# Patient Record
Sex: Male | Born: 1970 | Race: White | Hispanic: No | Marital: Single | Smoking: Current every day smoker
Health system: Southern US, Community
[De-identification: ages and names within clinical notes are randomized; demographics above are authoritative.]

## PROBLEM LIST (undated history)

## (undated) DIAGNOSIS — J449 Chronic obstructive pulmonary disease, unspecified: Secondary | ICD-10-CM

## (undated) DIAGNOSIS — Z8719 Personal history of other diseases of the digestive system: Secondary | ICD-10-CM

## (undated) DIAGNOSIS — F329 Major depressive disorder, single episode, unspecified: Secondary | ICD-10-CM

## (undated) DIAGNOSIS — R51 Headache: Secondary | ICD-10-CM

## (undated) DIAGNOSIS — K219 Gastro-esophageal reflux disease without esophagitis: Secondary | ICD-10-CM

## (undated) DIAGNOSIS — M1990 Unspecified osteoarthritis, unspecified site: Secondary | ICD-10-CM

## (undated) DIAGNOSIS — F32A Depression, unspecified: Secondary | ICD-10-CM

## (undated) DIAGNOSIS — K469 Unspecified abdominal hernia without obstruction or gangrene: Secondary | ICD-10-CM

## (undated) DIAGNOSIS — F99 Mental disorder, not otherwise specified: Secondary | ICD-10-CM

## (undated) HISTORY — PX: OTHER SURGICAL HISTORY: SHX169

---

## 2013-06-04 ENCOUNTER — Emergency Department (HOSPITAL_COMMUNITY)
Admission: EM | Admit: 2013-06-04 | Discharge: 2013-06-04 | Discharge status: Against Medical Advice | Payer: Self-pay | Attending: Emergency Medicine | Admitting: Emergency Medicine

## 2013-06-04 ENCOUNTER — Encounter (HOSPITAL_COMMUNITY): Payer: Self-pay | Admitting: Emergency Medicine

## 2013-06-04 DIAGNOSIS — J449 Chronic obstructive pulmonary disease, unspecified: Secondary | ICD-10-CM | POA: Insufficient documentation

## 2013-06-04 DIAGNOSIS — J4489 Other specified chronic obstructive pulmonary disease: Secondary | ICD-10-CM | POA: Insufficient documentation

## 2013-06-04 DIAGNOSIS — Z5900 Homelessness unspecified: Secondary | ICD-10-CM | POA: Insufficient documentation

## 2013-06-04 DIAGNOSIS — R1032 Left lower quadrant pain: Secondary | ICD-10-CM | POA: Insufficient documentation

## 2013-06-04 DIAGNOSIS — F17200 Nicotine dependence, unspecified, uncomplicated: Secondary | ICD-10-CM | POA: Insufficient documentation

## 2013-06-04 HISTORY — DX: Chronic obstructive pulmonary disease, unspecified: J44.9

## 2013-06-04 HISTORY — DX: Unspecified abdominal hernia without obstruction or gangrene: K46.9

## 2013-06-04 NOTE — ED Notes (Addendum)
Pt has a hernia that he has had for 4232yrs and he says that the hernia is now "blocking his bowels."  Pt is pointing to the pain in his llq. Pt had a BM several days ago and he says he "may" have a boil on his right buttocks. Pt states "self-medicating" isn't working anymore. Pt smells of alcohol.

## 2013-06-04 NOTE — ED Notes (Signed)
This nurse brought pt back to room 7. Pt advised this nurse that he had drank his whole cup of coffee that this nurse had asked him not too. Pt then asked this nurse for a sandwich. This nurse advised pt that he was here for abdominal pain and that he could not eat or drink anything until seen by a doctor. Pt then said, "well, I'm not in pain. I am homeless and need somewhere to stay tonight." This nurse advised pt that he could not take up a room just because he was homeless. This nurse asked pt if he wanted to be seen and he said, "no!" Pt was taken up to the charge nurses desk where he was discharged due to his refusal of treatment. Pt was taken out to front, he asked where the restrooms were and this nurse advised pt where the restrooms were.

## 2013-06-06 ENCOUNTER — Encounter (HOSPITAL_COMMUNITY): Payer: Self-pay | Admitting: Emergency Medicine

## 2013-06-06 ENCOUNTER — Emergency Department (HOSPITAL_COMMUNITY)
Admission: EM | Admit: 2013-06-06 | Discharge: 2013-06-07 | Disposition: A | Discharge status: Home / Self Care | Payer: Self-pay | Attending: Emergency Medicine | Admitting: Emergency Medicine

## 2013-06-06 DIAGNOSIS — F1010 Alcohol abuse, uncomplicated: Secondary | ICD-10-CM | POA: Insufficient documentation

## 2013-06-06 DIAGNOSIS — R45851 Suicidal ideations: Secondary | ICD-10-CM | POA: Insufficient documentation

## 2013-06-06 DIAGNOSIS — Z8719 Personal history of other diseases of the digestive system: Secondary | ICD-10-CM | POA: Insufficient documentation

## 2013-06-06 DIAGNOSIS — G8929 Other chronic pain: Secondary | ICD-10-CM | POA: Insufficient documentation

## 2013-06-06 DIAGNOSIS — R1110 Vomiting, unspecified: Secondary | ICD-10-CM | POA: Insufficient documentation

## 2013-06-06 DIAGNOSIS — R109 Unspecified abdominal pain: Secondary | ICD-10-CM | POA: Insufficient documentation

## 2013-06-06 DIAGNOSIS — F17200 Nicotine dependence, unspecified, uncomplicated: Secondary | ICD-10-CM | POA: Insufficient documentation

## 2013-06-06 DIAGNOSIS — F10929 Alcohol use, unspecified with intoxication, unspecified: Secondary | ICD-10-CM

## 2013-06-06 DIAGNOSIS — R197 Diarrhea, unspecified: Secondary | ICD-10-CM | POA: Insufficient documentation

## 2013-06-06 DIAGNOSIS — J449 Chronic obstructive pulmonary disease, unspecified: Secondary | ICD-10-CM | POA: Insufficient documentation

## 2013-06-06 DIAGNOSIS — J4489 Other specified chronic obstructive pulmonary disease: Secondary | ICD-10-CM | POA: Insufficient documentation

## 2013-06-06 HISTORY — DX: Mental disorder, not otherwise specified: F99

## 2013-06-06 HISTORY — DX: Major depressive disorder, single episode, unspecified: F32.9

## 2013-06-06 HISTORY — DX: Depression, unspecified: F32.A

## 2013-06-06 LAB — CBC WITH DIFFERENTIAL/PLATELET
BASOS PCT: 0 % (ref 0–1)
Basophils Absolute: 0 10*3/uL (ref 0.0–0.1)
Eosinophils Absolute: 0.3 10*3/uL (ref 0.0–0.7)
Eosinophils Relative: 4 % (ref 0–5)
HCT: 43 % (ref 39.0–52.0)
HEMOGLOBIN: 14.8 g/dL (ref 13.0–17.0)
Lymphocytes Relative: 41 % (ref 12–46)
Lymphs Abs: 2.9 10*3/uL (ref 0.7–4.0)
MCH: 31.6 pg (ref 26.0–34.0)
MCHC: 34.4 g/dL (ref 30.0–36.0)
MCV: 91.9 fL (ref 78.0–100.0)
MONO ABS: 0.5 10*3/uL (ref 0.1–1.0)
Monocytes Relative: 7 % (ref 3–12)
NEUTROS ABS: 3.4 10*3/uL (ref 1.7–7.7)
Neutrophils Relative %: 48 % (ref 43–77)
Platelets: 229 10*3/uL (ref 150–400)
RBC: 4.68 MIL/uL (ref 4.22–5.81)
RDW: 14.7 % (ref 11.5–15.5)
WBC: 7.1 10*3/uL (ref 4.0–10.5)

## 2013-06-06 LAB — COMPREHENSIVE METABOLIC PANEL
ALBUMIN: 3.8 g/dL (ref 3.5–5.2)
ALT: 34 U/L (ref 0–53)
AST: 41 U/L — AB (ref 0–37)
Alkaline Phosphatase: 85 U/L (ref 39–117)
BUN: 7 mg/dL (ref 6–23)
CALCIUM: 9 mg/dL (ref 8.4–10.5)
CO2: 25 mEq/L (ref 19–32)
Chloride: 105 mEq/L (ref 96–112)
Creatinine, Ser: 0.73 mg/dL (ref 0.50–1.35)
GFR calc non Af Amer: >90 mL/min (ref >90)
GLUCOSE: 82 mg/dL (ref 70–99)
Potassium: 3.5 mEq/L — ABNORMAL LOW (ref 3.7–5.3)
SODIUM: 144 meq/L (ref 137–147)
TOTAL PROTEIN: 7 g/dL (ref 6.0–8.3)
Total Bilirubin: <0.2 mg/dL — ABNORMAL LOW (ref 0.3–1.2)

## 2013-06-06 LAB — LIPASE, BLOOD: Lipase: 36 U/L (ref 11–59)

## 2013-06-06 LAB — ETHANOL: Alcohol, Ethyl (B): 304 mg/dL — ABNORMAL HIGH (ref 0–11)

## 2013-06-06 MED ORDER — NICOTINE 14 MG/24HR TD PT24
14.0000 mg | MEDICATED_PATCH | Freq: Every day | TRANSDERMAL | Status: DC
  Administered 2013-06-07: 14 mg via TRANSDERMAL
  Filled 2013-06-06: qty 1

## 2013-06-06 MED ORDER — ONDANSETRON HCL 4 MG PO TABS: 4.0000 mg | ORAL_TABLET | Freq: Three times a day (TID) | ORAL | Status: DC | PRN

## 2013-06-06 MED ORDER — LORAZEPAM 1 MG PO TABS
1.0000 mg | ORAL_TABLET | ORAL | Status: DC | PRN
Start: 2013-06-06 — End: 2013-06-07

## 2013-06-06 NOTE — ED Provider Notes (Signed)
CSN: [id number]     Arrival date & time 06/06/13  2019 History   First MD Initiated Contact with Patient 06/06/13 2046     Chief Complaint  Patient presents with  . V70.1   Patient is a 43 y.o. male presenting with intoxication. The history is provided by the patient.  Alcohol Intoxication This is a new problem. The current episode started 6 to 12 hours ago. The problem occurs constantly. The problem has been gradually worsening. Associated symptoms include abdominal pain. Nothing aggravates the symptoms. Nothing relieves the symptoms.  pt presents for multiple complaints He reports he has been drinking ETOH all day and he is now suicidal - when asked his plan he reports "hang myself, or cut myself" He reports drinking only ETOH, denies drug use or other co-ingestants He also reports chronic abd pain and vomiting/diarrhea. His history is limited due to intoxication.    Past Medical History  Diagnosis Date  . COPD (chronic obstructive pulmonary disease)   . Hernia of abdominal cavity    Past Surgical History  Procedure Laterality Date  . 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     History reviewed. No pertinent family history. History  Substance Use Topics  . Smoking status: Current Every Day Smoker  . Smokeless tobacco: Not on file  . Alcohol Use: Yes    Review of Systems  Unable to perform ROS: Other  Gastrointestinal: Positive for abdominal pain.  pt is intoxicated    Allergies  Review of patient's allergies indicates no known allergies.  Home Medications  No current outpatient prescriptions on file. BP 107/80  Pulse 96  Temp(Src) 97.4 F (36.3 C) (Oral)  Resp 20  Ht 5\' 9"  (1.753 m)  Wt 130 lb (58.968 kg)  BMI 19.19 kg/m2  SpO2 96% Physical Exam CONSTITUTIONAL: thin, disheveled HEAD: Normocephalic/atraumatic EYES: EOMI ENMT: Mucous membranes dry NECK: supple no meningeal signs, well healed scars to neck, no erythema  or bruising noted CV: S1/S2 noted, no murmurs/rubs/gallops noted LUNGS: Lungs are clear to auscultation bilaterally, no apparent distress ABDOMEN: soft, nontender, no rebound or guarding.  Well healed scars noted GU:no cva tenderness NEURO: Pt is awake/alert, moves all extremitiesx4 EXTREMITIES: pulses normal, full ROM SKIN: warm, color normal PSYCH: anxious  ED Course  Procedures (including critical care time) 9:04 PM Pt is currently intoxicated reporting SI Labs pending at this time 10:28 PM ETOH intoxication noted Pt stable, awaiting telepsych, he is medically stable BP 107/80  Pulse 96  Temp(Src) 97.4 F (36.3 C) (Oral)  Resp 20  Ht 5\' 9"  (1.753 m)  Wt 130 lb (58.968 kg)  BMI 19.19 kg/m2  SpO2 96%   Labs Review Labs Reviewed  COMPREHENSIVE METABOLIC PANEL  CBC WITH DIFFERENTIAL  ETHANOL  URINE RAPID DRUG SCREEN (HOSP PERFORMED)  LIPASE, BLOOD   Imaging Review No results found.  EKG Interpretation   None       MDM   Final diagnoses:  None    Nursing notes including past medical history and social history reviewed and considered in documentation Labs/vital reviewed and considered     Joya Gaskinsonald W Tenleigh Byer, MD 06/06/13 2228

## 2013-06-06 NOTE — ED Notes (Signed)
Encouraged patient to urinate. Patient reports he will try.

## 2013-06-06 NOTE — ED Notes (Addendum)
abd pain, Has had "several beer today".  Pt says vomiting and diarrhea.  Then says he is suicidal  Wanded at triage

## 2013-06-07 ENCOUNTER — Inpatient Hospital Stay (HOSPITAL_COMMUNITY)
Admission: EM | Admit: 2013-06-07 | Discharge: 2013-06-14 | DRG: 897 | Disposition: A | Discharge status: Other Acute Inpatient Hospital | Payer: 59 | Source: Intra-hospital | Attending: General Surgery | Admitting: General Surgery

## 2013-06-07 ENCOUNTER — Encounter (HOSPITAL_COMMUNITY): Payer: Self-pay | Admitting: *Deleted

## 2013-06-07 DIAGNOSIS — F4310 Post-traumatic stress disorder, unspecified: Secondary | ICD-10-CM | POA: Diagnosis present

## 2013-06-07 DIAGNOSIS — M129 Arthropathy, unspecified: Secondary | ICD-10-CM | POA: Diagnosis present

## 2013-06-07 DIAGNOSIS — F322 Major depressive disorder, single episode, severe without psychotic features: Secondary | ICD-10-CM | POA: Diagnosis present

## 2013-06-07 DIAGNOSIS — J4489 Other specified chronic obstructive pulmonary disease: Secondary | ICD-10-CM | POA: Diagnosis present

## 2013-06-07 DIAGNOSIS — K4030 Unilateral inguinal hernia, with obstruction, without gangrene, not specified as recurrent: Secondary | ICD-10-CM | POA: Diagnosis present

## 2013-06-07 DIAGNOSIS — K219 Gastro-esophageal reflux disease without esophagitis: Secondary | ICD-10-CM | POA: Diagnosis present

## 2013-06-07 DIAGNOSIS — G4700 Insomnia, unspecified: Secondary | ICD-10-CM | POA: Diagnosis present

## 2013-06-07 DIAGNOSIS — F410 Panic disorder [episodic paroxysmal anxiety] without agoraphobia: Secondary | ICD-10-CM | POA: Diagnosis present

## 2013-06-07 DIAGNOSIS — Z5900 Homelessness unspecified: Secondary | ICD-10-CM

## 2013-06-07 DIAGNOSIS — F411 Generalized anxiety disorder: Secondary | ICD-10-CM | POA: Diagnosis present

## 2013-06-07 DIAGNOSIS — F329 Major depressive disorder, single episode, unspecified: Secondary | ICD-10-CM

## 2013-06-07 DIAGNOSIS — J449 Chronic obstructive pulmonary disease, unspecified: Secondary | ICD-10-CM | POA: Diagnosis present

## 2013-06-07 DIAGNOSIS — F17200 Nicotine dependence, unspecified, uncomplicated: Secondary | ICD-10-CM | POA: Diagnosis present

## 2013-06-07 DIAGNOSIS — R45851 Suicidal ideations: Secondary | ICD-10-CM

## 2013-06-07 DIAGNOSIS — F1994 Other psychoactive substance use, unspecified with psychoactive substance-induced mood disorder: Secondary | ICD-10-CM | POA: Diagnosis present

## 2013-06-07 DIAGNOSIS — F1020 Alcohol dependence, uncomplicated: Principal | ICD-10-CM | POA: Diagnosis present

## 2013-06-07 HISTORY — DX: Headache: R51

## 2013-06-07 HISTORY — DX: Unspecified osteoarthritis, unspecified site: M19.90

## 2013-06-07 HISTORY — DX: Gastro-esophageal reflux disease without esophagitis: K21.9

## 2013-06-07 HISTORY — DX: Personal history of other diseases of the digestive system: Z87.19

## 2013-06-07 LAB — RAPID URINE DRUG SCREEN, HOSP PERFORMED
Amphetamines: NOT DETECTED
BARBITURATES: NOT DETECTED
BENZODIAZEPINES: NOT DETECTED
COCAINE: NOT DETECTED
Opiates: NOT DETECTED
TETRAHYDROCANNABINOL: NOT DETECTED

## 2013-06-07 LAB — ETHANOL: Alcohol, Ethyl (B): 112 mg/dL — ABNORMAL HIGH (ref 0–11)

## 2013-06-07 MED ORDER — CHLORDIAZEPOXIDE HCL 25 MG PO CAPS
25.0000 mg | ORAL_CAPSULE | Freq: Four times a day (QID) | ORAL | Status: AC
  Administered 2013-06-07 (×3): 25 mg via ORAL
  Filled 2013-06-07 (×4): qty 1

## 2013-06-07 MED ORDER — GI COCKTAIL ~~LOC~~
30.0000 mL | Freq: Three times a day (TID) | ORAL | Status: DC | PRN
  Administered 2013-06-07 – 2013-06-14 (×16): 30 mL via ORAL
  Filled 2013-06-07 (×6): qty 30

## 2013-06-07 MED ORDER — CHLORDIAZEPOXIDE HCL 25 MG PO CAPS
25.0000 mg | ORAL_CAPSULE | Freq: Three times a day (TID) | ORAL | Status: AC
  Administered 2013-06-08 (×3): 25 mg via ORAL
  Filled 2013-06-07 (×2): qty 1

## 2013-06-07 MED ORDER — GI COCKTAIL ~~LOC~~
30.0000 mL | Freq: Once | ORAL | Status: AC
  Administered 2013-06-07: 30 mL via ORAL
  Filled 2013-06-07: qty 30

## 2013-06-07 MED ORDER — CHLORDIAZEPOXIDE HCL 25 MG PO CAPS
25.0000 mg | ORAL_CAPSULE | ORAL | Status: AC
  Administered 2013-06-09: 25 mg via ORAL
  Filled 2013-06-07 (×4): qty 1

## 2013-06-07 MED ORDER — VITAMIN B-1 100 MG PO TABS
100.0000 mg | ORAL_TABLET | Freq: Every day | ORAL | Status: DC
  Administered 2013-06-08 – 2013-06-14 (×6): 100 mg via ORAL
  Filled 2013-06-07 (×10): qty 1

## 2013-06-07 MED ORDER — CHLORDIAZEPOXIDE HCL 25 MG PO CAPS
25.0000 mg | ORAL_CAPSULE | Freq: Every day | ORAL | Status: AC
  Administered 2013-06-10: 25 mg via ORAL

## 2013-06-07 MED ORDER — LOPERAMIDE HCL 2 MG PO CAPS: 2.0000 mg | ORAL_CAPSULE | ORAL | Status: AC | PRN

## 2013-06-07 MED ORDER — ADULT MULTIVITAMIN W/MINERALS CH
1.0000 | ORAL_TABLET | Freq: Every day | ORAL | Status: DC
  Administered 2013-06-07 – 2013-06-14 (×7): 1 via ORAL
  Filled 2013-06-07 (×10): qty 1

## 2013-06-07 MED ORDER — ACETAMINOPHEN 325 MG PO TABS
650.0000 mg | ORAL_TABLET | Freq: Four times a day (QID) | ORAL | Status: DC | PRN
Start: 2013-06-07 — End: 2013-06-14
  Administered 2013-06-07 – 2013-06-13 (×6): 650 mg via ORAL
  Filled 2013-06-07 (×7): qty 2

## 2013-06-07 MED ORDER — HYDROXYZINE HCL 25 MG PO TABS
25.0000 mg | ORAL_TABLET | Freq: Four times a day (QID) | ORAL | Status: AC | PRN
  Administered 2013-06-09: 25 mg via ORAL
  Filled 2013-06-07: qty 1

## 2013-06-07 MED ORDER — TRAZODONE HCL 50 MG PO TABS
50.0000 mg | ORAL_TABLET | Freq: Every evening | ORAL | Status: DC | PRN
  Filled 2013-06-07 (×7): qty 1

## 2013-06-07 MED ORDER — MAGNESIUM HYDROXIDE 400 MG/5ML PO SUSP
30.0000 mL | Freq: Every day | ORAL | Status: DC | PRN
Start: 2013-06-07 — End: 2013-06-14

## 2013-06-07 MED ORDER — CHLORDIAZEPOXIDE HCL 25 MG PO CAPS
25.0000 mg | ORAL_CAPSULE | Freq: Four times a day (QID) | ORAL | Status: AC | PRN
  Administered 2013-06-09: 25 mg via ORAL
  Filled 2013-06-07: qty 1

## 2013-06-07 MED ORDER — ONDANSETRON 4 MG PO TBDP: 4.0000 mg | ORAL_TABLET | Freq: Four times a day (QID) | ORAL | Status: AC | PRN

## 2013-06-07 MED ORDER — PANTOPRAZOLE SODIUM 40 MG PO TBEC
40.0000 mg | DELAYED_RELEASE_TABLET | Freq: Two times a day (BID) | ORAL | Status: DC
  Administered 2013-06-07 – 2013-06-14 (×15): 40 mg via ORAL
  Filled 2013-06-07 (×2): qty 1
  Filled 2013-06-07: qty 28
  Filled 2013-06-07 (×12): qty 1
  Filled 2013-06-07: qty 28
  Filled 2013-06-07 (×6): qty 1

## 2013-06-07 MED ORDER — THIAMINE HCL 100 MG/ML IJ SOLN
100.0000 mg | Freq: Once | INTRAMUSCULAR | Status: DC
  Filled 2013-06-07: qty 2

## 2013-06-07 MED ORDER — ALUM & MAG HYDROXIDE-SIMETH 200-200-20 MG/5ML PO SUSP: 30.0000 mL | ORAL | Status: DC | PRN

## 2013-06-07 MED ORDER — ENSURE COMPLETE PO LIQD
237.0000 mL | Freq: Two times a day (BID) | ORAL | Status: DC
  Administered 2013-06-07 – 2013-06-14 (×14): 237 mL via ORAL
  Filled 2013-06-07: qty 237

## 2013-06-07 NOTE — ED Notes (Signed)
Patient requesting nicotine patch

## 2013-06-07 NOTE — [facility] Suicide Risk Assessment (Signed)
[facility] INPATIENT:  Family/Significant Other Suicide Prevention Education  Suicide Prevention Education:  Patient Refusal for Family/Significant Other Suicide Prevention Education: The patient Bonnee QuinDonald Sudberry has refused to provide written consent for family/significant other to be provided Family/Significant Other Suicide Prevention Education during admission and/or prior to discharge.  Physician notified.   Pt reported that he has no family supports or supports within the community. SPE completed with pt. SPI pamphlet provided to pt and he was encouraged to share information with support network, ask questions, and talk about any concerns relating to SPE. Pt reports passive SI currently but is able to contract for safety on the unit.   Smart, Vayla Wilhelmi LCSWA  06/07/2013, 10:37 AM

## 2013-06-07 NOTE — ED Notes (Signed)
Gave patient sprite, crackers and peanut butter as requested.

## 2013-06-07 NOTE — Progress Notes (Signed)
Patient ID: Bryan Myers, male   DOB: [birth year], 43 y.o.   MRN: [id number] He was up and went down for noon meal and for his medication. Has been in bed this afternoon. No c/o  Withdrawals.
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
## 2013-06-07 NOTE — BH Assessment (Signed)
Assessment Note  Bryan Myers is a 43 y.o. male who presented to [facility], c/o SI w/plan to hang himself.  Pt says--"give me a rope and I'll do the rest".  Pt reports SI thoughts x2 yrs and has had "several attempts" by hanging, electrocution and cutting throat.  Pt says he was in the hospital for the last attempt(cutting throat) approx 2-3 yrs ago in a state hospital in [place].  Pt denies HI/AVH.  Pt reports drinking 2-12, 12oz cans of beer, daily, last drink was 06/06/13, pt drank 12 beers.  Pt is homeless.  Pt denies current w/d sxs.  Pt accepted by Donell SievertSpencer Simon, [place]; [phone].  Axis I: Disruptive mood dysregulation disorder; Alcohol use disorder, Severe Axis II: Deferred Axis III:  Past Medical History  Diagnosis Date  . COPD (chronic obstructive pulmonary disease)   . Hernia of abdominal cavity   . Mental disorder   . Depression    Axis IV: economic problems, housing problems, occupational problems, other psychosocial or environmental problems, problems related to social environment and problems with primary support group Axis V: 31-40 impairment in reality testing  Past Medical History:  Past Medical History  Diagnosis Date  . COPD (chronic obstructive pulmonary disease)   . Hernia of abdominal cavity   . Mental disorder   . Depression     Past Surgical History  Procedure Laterality Date  . 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      Family History: History reviewed. No pertinent family history.  Social History:  reports that he has been smoking.  He does not have any smokeless tobacco history on file. He reports that he drinks alcohol. He reports that he does not use illicit drugs.  Additional Social History:  Alcohol / Drug Use Pain Medications: None  Prescriptions: None  Over the Counter: None  History of alcohol / drug use?: Yes Longest period of sobriety (when/how long): None  Negative Consequences of Use: Work / School;Personal  relationships;Financial Withdrawal Symptoms: Other (Comment) (No current w/d sxs ) Substance #1 Name of Substance 1: Alcohol  1 - Age of First Use: Teens  1 - Amount (size/oz): 2-12 Beers  1 - Frequency: Daily  1 - Duration: On-going  1 - Last Use / Amount: 06/06/13  CIWA: CIWA-Ar BP: 119/77 mmHg Pulse Rate: 94 Nausea and Vomiting: no nausea and no vomiting Tactile Disturbances: none Tremor: no tremor Auditory Disturbances: not present Paroxysmal Sweats: no sweat visible Visual Disturbances: not present Anxiety: no anxiety, at ease Headache, Fullness in Head: none present Agitation: normal activity Orientation and Clouding of Sensorium: oriented and can do serial additions CIWA-Ar Total: 0 COWS:    Allergies: No Known Allergies  Home Medications:  (Not in a hospital admission)  OB/GYN Status:  No LMP for male patient.  General Assessment Data Location of Assessment: [facility] Is this a Tele or Face-to-Face Assessment?: Tele Assessment Is this an Initial Assessment or a Re-assessment for this encounter?: Initial Assessment Living Arrangements: Other (Comment) (Homeless ) Can pt return to current living arrangement?: Yes Admission Status: Voluntary Is patient capable of signing voluntary admission?: Yes Transfer from: Acute Hospital Referral Source: MD  Medical Screening Exam [facility] Walk-in ONLY) Medical Exam completed: No Reason for MSE not completed: Other: (None )  [facility] Living Arrangements: Other (Comment) (Homeless ) Name of Psychiatrist: None  Name of Therapist: None   Education Status Is patient currently in school?: No Current Grade: None  Highest grade of school patient has completed: None  Name of school: None  Contact person: None   Risk to self Suicidal Ideation: Yes-Currently Present Suicidal Intent: Yes-Currently Present Is patient at risk for suicide?: Yes Suicidal Plan?: Yes-Currently Present Specify Current Suicidal Plan: Hang  Self  Access to Means: Yes Specify Access to Suicidal Means: Ropes  What has been your use of drugs/alcohol within the last 12 months?: Abusing: alcohol  Previous Attempts/Gestures: Yes How many times?:  ("Several" ) Other Self Harm Risks: None  Triggers for Past Attempts: Unpredictable Intentional Self Injurious Behavior: None Family Suicide History: No Recent stressful life event(s): Other (Comment);Financial Problems (Homeless ) Persecutory voices/beliefs?: No Depression: Yes Depression Symptoms: Loss of interest in usual pleasures;Feeling angry/irritable;Feeling worthless/self pity Substance abuse history and/or treatment for substance abuse?: Yes Suicide prevention information given to non-admitted patients: Not applicable  Risk to Others Homicidal Ideation: No Thoughts of Harm to Others: No Current Homicidal Intent: No Current Homicidal Plan: No Access to Homicidal Means: No Identified Victim: None  History of harm to others?: No Assessment of Violence: None Noted Violent Behavior Description: None  Does patient have access to weapons?: No Criminal Charges Pending?: No Does patient have a court date: No  Psychosis Hallucinations: None noted Delusions: None noted  Mental Status Report Appear/Hygiene: Disheveled;Poor hygiene Eye Contact: Fair Motor Activity: Unremarkable Speech: Logical/coherent;Soft Level of Consciousness: Alert Mood: Irritable Affect: Irritable Anxiety Level: None Thought Processes: Coherent;Relevant Judgement: Impaired Orientation: Person;Place;Time;Situation Obsessive Compulsive Thoughts/Behaviors: None  Cognitive Functioning Concentration: Normal Memory: Recent Intact;Remote Intact IQ: Average Insight: Poor Impulse Control: Poor Appetite: Fair Weight Loss: 0 Weight Gain: 0 Sleep: Decreased Total Hours of Sleep: 5 Vegetative Symptoms: None  ADLScreening [facility] Assessment Services) Patient's cognitive ability adequate to safely  complete daily activities?: Yes Patient able to express need for assistance with ADLs?: Yes Independently performs ADLs?: Yes (appropriate for developmental age)  Prior Inpatient Therapy Prior Inpatient Therapy: Yes Prior Therapy Dates: 2-3 yrs ago  Prior Therapy Facilty/Provider(s): [place]  Reason for Treatment: Detox   Prior Outpatient Therapy Prior Outpatient Therapy: No Prior Therapy Dates: None  Prior Therapy Facilty/Provider(s): None  Reason for Treatment: None   ADL Screening (condition at time of admission) Patient's cognitive ability adequate to safely complete daily activities?: Yes Is the patient deaf or have difficulty hearing?: No Does the patient have difficulty seeing, even when wearing glasses/contacts?: No Does the patient have difficulty concentrating, remembering, or making decisions?: No Patient able to express need for assistance with ADLs?: Yes Does the patient have difficulty dressing or bathing?: No Independently performs ADLs?: Yes (appropriate for developmental age) Does the patient have difficulty walking or climbing stairs?: No Weakness of Legs: None Weakness of Arms/Hands: None  Home Assistive Devices/Equipment Home Assistive Devices/Equipment: None  Therapy Consults (therapy consults require a physician order) PT Evaluation Needed: No OT Evalulation Needed: No SLP Evaluation Needed: No Abuse/Neglect Assessment (Assessment to be complete while patient is alone) Physical Abuse: Denies Verbal Abuse: Denies Sexual Abuse: Denies Exploitation of patient/patient's resources: Denies Self-Neglect: Denies Values / Beliefs Cultural Requests During Hospitalization: None Spiritual Requests During Hospitalization: None Consults Spiritual Care Consult Needed: No Social Work Consult Needed: No Merchant navy officer (For Healthcare) Advance Directive: Patient does not have advance directive;Patient would not like information Pre-existing out of facility DNR  order (yellow form or pink MOST form): No Nutrition Screen- [facility] Adult/[facility] Patient's home diet: Regular  Additional Information 1:1 In Past 12 Months?: No CIRT Risk: No Elopement Risk: No Does patient  have medical clearance?: Yes     Disposition:  Disposition Initial Assessment Completed for this Encounter: Yes Disposition of Patient: Inpatient treatment program;Referred to (Accepted by Donell Sievert, PA; [phone]) Type of inpatient treatment program: Adult Patient referred to: Other (Comment) (Accepted by Donell Sievert, PA; [phone])  On Site Evaluation by:   Reviewed with Physician:    Murrell Redden 06/07/2013 4:58 AM

## 2013-06-07 NOTE — Progress Notes (Signed)
Pt's BP was 88/54.  Pt asymptomatic.  Gatorade given.  Will recheck.

## 2013-06-07 NOTE — [facility] Group Notes (Signed)
[facility] LCSW Group Therapy  06/07/2013 3:20 PM  Type of Therapy:  Group Therapy  Participation Level:  Did Not Attend-pt sleeping   Smart, Ila Landowski LCSWA  06/07/2013, 3:20 PM
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
## 2013-06-07 NOTE — Progress Notes (Signed)
Recreation Therapy Notes  Animal-Assisted Activity/Therapy (AAA/T) Program Checklist/Progress Notes Patient Eligibility Criteria Checklist & Daily Group note for Rec Tx Intervention  Date: 02.10.2015 Time: 2:45pm [address]    AAA/T Program Assumption of Risk Form signed by Patient/ or Parent Legal Guardian yes  Patient is free of allergies or sever asthma yes  Patient reports no fear of animals yes  Patient reports no history of cruelty to animals yes   Patient understands his/her participation is voluntary yes  Behavioral Response: Did not attend  [employer] Specialty ChemicalsDenise L Rayson Rando, LRT/CTRS  Kaijah Abts L 06/07/2013 4:21 PM
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
## 2013-06-08 MED ORDER — NICOTINE 21 MG/24HR TD PT24
21.0000 mg | MEDICATED_PATCH | Freq: Every day | TRANSDERMAL | Status: DC
  Administered 2013-06-08 – 2013-06-14 (×7): 21 mg via TRANSDERMAL
  Filled 2013-06-08 (×10): qty 1

## 2013-06-08 MED ORDER — TRAZODONE HCL 50 MG PO TABS
75.0000 mg | ORAL_TABLET | Freq: Every day | ORAL | Status: DC
  Administered 2013-06-08 – 2013-06-13 (×5): 75 mg via ORAL
  Filled 2013-06-08 (×6): qty 1.5
  Filled 2013-06-08: qty 21
  Filled 2013-06-08: qty 1.5

## 2013-06-08 MED ORDER — MIRTAZAPINE 15 MG PO TABS
15.0000 mg | ORAL_TABLET | Freq: Every day | ORAL | Status: DC
  Administered 2013-06-08: 15 mg via ORAL
  Filled 2013-06-08 (×3): qty 1
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
## 2013-06-08 NOTE — Progress Notes (Signed)
Patient ID: Bryan Myers, male   DOB: 03/04/1971, 43 y.o.   MRN: [id number] D: Pt. Visible on dayroom watching TV this evening. Pt. Reports "getting better" reports depression at "10" of 10. A: Writer introduced self to client and reviewed medications. Staff will monitor q4215min for safety. R: Pt. Is safe on the unit and attended group.

## 2013-06-08 NOTE — [facility] Group Notes (Signed)
[facility]   06/08/2013 10:49 AM  Participation Quality:  DID NOT ATTEND-pt refused to attend d/c planning and returned to room to rest.   Smart, Lebron QuamHeather LCSWA

## 2013-06-08 NOTE — Progress Notes (Signed)
[facility] MD Progress Note  06/08/2013 2:36 PM Forney Kleinpeter  MRN:  [id number]  Subjective:  Sriman reports, "I guess I'm getting there. I am shaking and hurting all over, aches and pain all over my body. I'm sleeping for may be 4-5 hours every night, my appetite is very poor. My depression rate is at #10, anxiety #8. I feel suicidal all the time. I'm just not doing my best at this time".  Diagnosis:   DSM5: Schizophrenia Disorders:  NA Obsessive-Compulsive Disorders:  NA Trauma-Stressor Disorders:  Posttraumatic Stress Disorder (309.81) Substance/Addictive Disorders:  Alcohol Related Disorder - Severe (303.90) Depressive Disorders:  Substance induced mood disorder  Total Time spent with patient: 30 minutes  Axis I: Alcohol Related Disorder - Severe (303.90), PTSD, Substance induced mood disorder Axis II: Deferred Axis III:  Past Medical History  Diagnosis Date  . COPD (chronic obstructive pulmonary disease)   . Hernia of abdominal cavity   . Mental disorder   . Depression   . GERD (gastroesophageal reflux disease)   . H/O hiatal hernia   . Headache(784.0)   . Arthritis    Axis IV: other psychosocial or environmental problems and Alcoholism, chronic Axis V: 41-50 serious symptoms  ADL's:  Fairly intact  Sleep: "Poor"  Appetite:  "Poor"  Suicidal Ideation: "Yes, all the time" Plan:  Denies Intent:  Denies Means:  Denies  Homicidal Ideation:  Plan:  Denies Intent:  Denies Means:  Denies  AEB (as evidenced by):  Psychiatric Specialty Exam: Physical Exam  Review of Systems  Constitutional: Positive for malaise/fatigue.  HENT: Negative.   Eyes: Negative.   Respiratory: Negative.   Cardiovascular: Negative.   Gastrointestinal: Negative.   Genitourinary: Negative.   Musculoskeletal: Positive for back pain, joint pain and myalgias.  Skin: Negative.   Neurological: Positive for tremors and weakness.  Endo/Heme/Allergies: Negative.   Psychiatric/Behavioral: Positive for  depression (Rated #10), suicidal ideas (Constant) and substance abuse (Alcoholism). Negative for hallucinations and memory loss. The patient is nervous/anxious and has insomnia.     Blood pressure 92/60, pulse 77, temperature 98.4 F (36.9 C), temperature source Oral, resp. rate 18, height $RemoveBe'5\' 10"'unBYvLawb$  (1.778 m), weight 58.968 kg (130 lb), SpO2 96.00%.Body mass index is 18.65 kg/(m^2).  General Appearance: Casual, Guarded and sad  Eye Contact::  Fair  Speech:  Clear and Coherent  Volume:  Normal  Mood:  Depressed  Affect:  Congruent and Flat  Thought Process:  Coherent and Intact  Orientation:  Full (Time, Place, and Person)  Thought Content:  Rumination  Suicidal Thoughts:  Yes.  without intent/plan  Homicidal Thoughts:  No  Memory:  Immediate;   Good Recent;   Good Remote;   Good  Judgement:  Fair  Insight:  Fair  Psychomotor Activity:  Restlessness and Tremor  Concentration:  Fair  Recall:  Good  Fund of Knowledge:Good  Language: Good  Akathisia:  No  Handed:  Right  AIMS (if indicated):     Assets:  Desire for Improvement  Sleep:  Number of Hours: 6.75   Musculoskeletal: Strength & Muscle Tone: within normal limits Gait & Station: normal Patient leans: N/A  Current Medications: Current Facility-Administered Medications  Medication Dose Route Frequency Provider Last Rate Last Dose  . acetaminophen (TYLENOL) tablet 650 mg  650 mg Oral Q6H PRN Laverle Hobby, PA-C   650 mg at 06/08/13 0854  . alum & mag hydroxide-simeth (MAALOX/MYLANTA) 200-200-20 MG/5ML suspension 30 mL  30 mL Oral Q4H PRN Laverle Hobby, PA-C      .  chlordiazePOXIDE (LIBRIUM) capsule 25 mg  25 mg Oral Q6H PRN Laverle Hobby, PA-C      . chlordiazePOXIDE (LIBRIUM) capsule 25 mg  25 mg Oral TID Laverle Hobby, PA-C   25 mg at 06/08/13 1207   Followed by  . [START ON 06/09/2013] chlordiazePOXIDE (LIBRIUM) capsule 25 mg  25 mg Oral BH-qamhs Spencer E Simon, PA-C       Followed by  . [START ON 06/10/2013]  chlordiazePOXIDE (LIBRIUM) capsule 25 mg  25 mg Oral Daily Spencer E Simon, PA-C      . feeding supplement (ENSURE COMPLETE) (ENSURE COMPLETE) liquid 237 mL  237 mL Oral BID BM Christie Beckers, RD   237 mL at 06/08/13 1423  . gi cocktail (Maalox,Lidocaine,Donnatal)  30 mL Oral TID PRN Nicholaus Bloom, MD   30 mL at 06/08/13 1422  . hydrOXYzine (ATARAX/VISTARIL) tablet 25 mg  25 mg Oral Q6H PRN Laverle Hobby, PA-C      . loperamide (IMODIUM) capsule 2-4 mg  2-4 mg Oral PRN Laverle Hobby, PA-C      . magnesium hydroxide (MILK OF MAGNESIA) suspension 30 mL  30 mL Oral Daily PRN Laverle Hobby, PA-C      . multivitamin with minerals tablet 1 tablet  1 tablet Oral Daily Laverle Hobby, PA-C   1 tablet at 06/08/13 0851  . nicotine (NICODERM CQ - dosed in mg/24 hours) patch 21 mg  21 mg Transdermal Q0600 Nicholaus Bloom, MD   21 mg at 06/08/13 1422  . ondansetron (ZOFRAN-ODT) disintegrating tablet 4 mg  4 mg Oral Q6H PRN Laverle Hobby, PA-C      . pantoprazole (PROTONIX) EC tablet 40 mg  40 mg Oral BID Nicholaus Bloom, MD   40 mg at 06/08/13 0851  . thiamine (B-1) injection 100 mg  100 mg Intramuscular Once [employer], PA-C      . thiamine (VITAMIN B-1) tablet 100 mg  100 mg Oral Daily Laverle Hobby, PA-C   100 mg at 06/08/13 0851  . traZODone (DESYREL) tablet 50 mg  50 mg Oral QHS,MR X 1 Laverle Hobby, PA-C        Lab Results:  Results for orders placed during the hospital encounter of 06/06/13 (from the past 48 hour(s))  COMPREHENSIVE METABOLIC PANEL     Status: Abnormal   Collection Time    06/06/13  9:18 PM      Result Value Ref Range   Sodium 144  137 - 147 mEq/L   Potassium 3.5 (*) 3.7 - 5.3 mEq/L   Chloride 105  96 - 112 mEq/L   CO2 25  19 - 32 mEq/L   Glucose, Bld 82  70 - 99 mg/dL   BUN 7  6 - 23 mg/dL   Creatinine, Ser 0.73  0.50 - 1.35 mg/dL   Calcium 9.0  8.4 - 10.5 mg/dL   Total Protein 7.0  6.0 - 8.3 g/dL   Albumin 3.8  3.5 - 5.2 g/dL   AST 41 (*) 0 - 37 U/L   ALT 34   0 - 53 U/L   Alkaline Phosphatase 85  39 - 117 U/L   Total Bilirubin <0.2 (*) 0.3 - 1.2 mg/dL   GFR calc non Af Amer >90  >90 mL/min   GFR calc Af Amer >90  >90 mL/min   Comment: (NOTE)     The eGFR has been calculated using the CKD EPI equation.  This calculation has not been validated in all clinical situations.     eGFR's persistently <90 mL/min signify possible Chronic Kidney     Disease.  CBC WITH DIFFERENTIAL     Status: None   Collection Time    06/06/13  9:18 PM      Result Value Ref Range   WBC 7.1  4.0 - 10.5 K/uL   RBC 4.68  4.22 - 5.81 MIL/uL   Hemoglobin 14.8  13.0 - 17.0 g/dL   HCT 43.0  39.0 - 52.0 %   MCV 91.9  78.0 - 100.0 fL   MCH 31.6  26.0 - 34.0 pg   MCHC 34.4  30.0 - 36.0 g/dL   RDW 14.7  11.5 - 15.5 %   Platelets 229  150 - 400 K/uL   Neutrophils Relative % 48  43 - 77 %   Neutro Abs 3.4  1.7 - 7.7 K/uL   Lymphocytes Relative 41  12 - 46 %   Lymphs Abs 2.9  0.7 - 4.0 K/uL   Monocytes Relative 7  3 - 12 %   Monocytes Absolute 0.5  0.1 - 1.0 K/uL   Eosinophils Relative 4  0 - 5 %   Eosinophils Absolute 0.3  0.0 - 0.7 K/uL   Basophils Relative 0  0 - 1 %   Basophils Absolute 0.0  0.0 - 0.1 K/uL  ETHANOL     Status: Abnormal   Collection Time    06/06/13  9:18 PM      Result Value Ref Range   Alcohol, Ethyl (B) 304 (*) 0 - 11 mg/dL   Comment:            LOWEST DETECTABLE LIMIT FOR     SERUM ALCOHOL IS 11 mg/dL     FOR MEDICAL PURPOSES ONLY  LIPASE, BLOOD     Status: None   Collection Time    06/06/13  9:18 PM      Result Value Ref Range   Lipase 36  11 - 59 U/L  URINE RAPID DRUG SCREEN (HOSP PERFORMED)     Status: None   Collection Time    06/07/13  3:39 AM      Result Value Ref Range   Opiates NONE DETECTED  NONE DETECTED   Cocaine NONE DETECTED  NONE DETECTED   Benzodiazepines NONE DETECTED  NONE DETECTED   Amphetamines NONE DETECTED  NONE DETECTED   Tetrahydrocannabinol NONE DETECTED  NONE DETECTED   Barbiturates NONE DETECTED  NONE  DETECTED   Comment:            DRUG SCREEN FOR MEDICAL PURPOSES     ONLY.  IF CONFIRMATION IS NEEDED     FOR ANY PURPOSE, NOTIFY LAB     WITHIN 5 DAYS.                LOWEST DETECTABLE LIMITS     FOR URINE DRUG SCREEN     Drug Class       Cutoff (ng/mL)     Amphetamine      1000     Barbiturate      200     Benzodiazepine   229     Tricyclics       798     Opiates          300     Cocaine          300     THC  50  ETHANOL     Status: Abnormal   Collection Time    06/07/13  4:27 AM      Result Value Ref Range   Alcohol, Ethyl (B) 112 (*) 0 - 11 mg/dL   Comment:            LOWEST DETECTABLE LIMIT FOR     SERUM ALCOHOL IS 11 mg/dL     FOR MEDICAL PURPOSES ONLY    Physical Findings: AIMS: Facial and Oral Movements Muscles of Facial Expression: None, normal Lips and Perioral Area: None, normal Jaw: None, normal Tongue: None, normal,Extremity Movements Upper (arms, wrists, hands, fingers): None, normal Lower (legs, knees, ankles, toes): None, normal, Trunk Movements Neck, shoulders, hips: None, normal, Overall Severity Severity of abnormal movements (highest score from questions above): None, normal Incapacitation due to abnormal movements: None, normal Patient's awareness of abnormal movements (rate only patient's report): No Awareness, Dental Status Current problems with teeth and/or dentures?: No Does patient usually wear dentures?: No  CIWA:  CIWA-Ar Total: 2 COWS:  COWS Total Score: 3  Treatment Plan Summary: Daily contact with patient to assess and evaluate symptoms and progress in treatment Medication management  Plan: Review of chart, vital signs, medications, and notes. 1-Individual and group therapy 2-Medication management for depression and anxiety; denies any adverse effects. (a). Initiate Remeron 15 mg Q hs for depression/insomnia. (b). Increased Trazodone from 50 mg to 75 mg Q bedtime for sleep 3-Coping skills for depression, anxiety, and  substance dependency 4-Continue crisis stabilization and management 5-Address health issues--monitoring vital signs, stable 6-Treatment plan in progress to prevent relapse of depression, substance abuse, and anxiety  Medical Decision Making Problem Points:  Established problem, stable/improving (1), Review of last therapy session (1) and Review of psycho-social stressors (1) Data Points:  Review of medication regiment & side effects (2) Review of new medications or change in dosage (2)  I certify that inpatient services furnished can reasonably be expected to improve the patient's condition.   Encarnacion Slates, PMHNP-BC 06/08/2013, 2:36 PM Agree with assessment and plan Geralyn Flash A. Sabra Heck, M.D.

## 2013-06-08 NOTE — Progress Notes (Signed)
Adult Psychoeducational Group Note  Date:  06/08/2013 Time:  10:41 PM  Group Topic/Focus:  NA group  Participation Level:  Active  Participation Quality:  Appropriate  Affect:  Appropriate  Cognitive:  Alert  Insight: Appropriate  Engagement in Group:  Engaged  Modes of Intervention:  Discussion  Additional Comments:    Flonnie HailstoneCOOKE, Sparrow [place] R 06/08/2013, 10:41 PM

## 2013-06-08 NOTE — Progress Notes (Signed)
  D: Pt observed sleeping in bed with eyes closed. RR even and unlabored. No distress noted  .  A: Q 15 minute checks were done for safety.  R: safety maintained on unit.  

## 2013-06-08 NOTE — [facility] Group Notes (Signed)
[facility] LCSW Group Therapy  06/08/2013 3:32 PM  Type of Therapy:  Group Therapy  Participation Level:  Minimal  Participation Quality:  Drowsy  Affect:  Depressed and Flat  Cognitive:  Oriented  Insight:  Limited  Engagement in Therapy:  Limited  Modes of Intervention:  Confrontation, Discussion, Education, Problem-solving, Rapport Building, Socialization and Support  Summary of Progress/Problems: Emotion Regulation: This group focused on both positive and negative emotion identification and allowed group members to process ways to identify feelings, regulate negative emotions, and find healthy ways to manage internal/external emotions. Group members were asked to reflect on a time when their reaction to an emotion led to a negative outcome and explored how alternative responses using emotion regulation would have benefited them. Group members were also asked to discuss a time when emotion regulation was utilized when a negative emotion was experienced. Bryan Myers was attentive throughout group. He shared that depression is the emotion he struggles with regulating. "I feel like I'm in a hole and I can't pull myself out." "I feel like giving up." Bryan Myers demonstrates some progress in the group setting AEB his ability to participate in group discussion and add on to others' statements regarding emotion regulation. Bryan Myers shows limited insight AEB his inability to identify a short term goal for coping with intense negative emotions.    Smart, Bryan Myers LCSWA 06/08/2013, 3:32 PM

## 2013-06-08 NOTE — Progress Notes (Signed)
D:  Patient's self inventory sheet, patient has fair sleep, poor appetite, low energy level, poor attention span.  Rated depression 10, hopeless 10, anxiety 9.  Has experienced tremors, shaking.  SI off/on, contracts for safety.  Has experienced headache/achy in past 24 hours.  No discharge plans.  Needs financial assistance to purchase meds after discharge. A:  Medications administered per MD orders.  Emotional support and encouragement given patient. R:  Denied HI.  SI off/on, contracts for safety.  Denied A/V hallucinations.  Will continue to monitor patient for safety with 15 minute checks.  Safety maintained. Patient stated he has bad dreams.

## 2013-06-09 MED ORDER — MIRTAZAPINE 30 MG PO TABS
30.0000 mg | ORAL_TABLET | Freq: Every day | ORAL | Status: DC
  Administered 2013-06-09 – 2013-06-11 (×3): 30 mg via ORAL
  Filled 2013-06-09 (×4): qty 1

## 2013-06-09 NOTE — Progress Notes (Signed)
[facility]  06/09/2013 1:46 PM Bryan Myers  MRN:  [id number] Subjective:  Bryan Myers is isolating, avoids interaction with staff and other patients. He is depressed, hopeless, helpless, has "given up", wants to die. Has several plans of how to do it when he gets out of here. Can say he will not hurt himself while here Diagnosis:   DSM5: Schizophrenia Disorders:  none Obsessive-Compulsive Disorders:  none Trauma-Stressor Disorders:  Posttraumatic Stress Disorder (309.81) Substance/Addictive Disorders:  Alcohol Related Disorder - Severe (303.90) Depressive Disorders:  Major Depressive Disorder - Severe (296.23) Total Time spent with patient: 20 minutes  Axis I: Substance Induced Mood Disorder  ADL's:  Intact  Sleep: Poor  Appetite:  Poor  Suicidal Ideation:  Plan:  denies Intent:  denies Means:  denies Homicidal Ideation:  Plan:  denies Intent:  denies Means:  denies AEB (as evidenced by):  Psychiatric Specialty Exam: Physical Exam  Review of Systems  Constitutional: Positive for malaise/fatigue.  Eyes: Negative.   Respiratory: Negative.   Cardiovascular: Negative.   Gastrointestinal: Positive for heartburn.  Genitourinary: Negative.   Musculoskeletal: Positive for myalgias.  Skin: Negative.   Neurological: Positive for weakness.  Endo/Heme/Allergies: Negative.   Psychiatric/Behavioral: Positive for depression, suicidal ideas and substance abuse. The patient is nervous/anxious and has insomnia.     Blood pressure 90/61, pulse 80, temperature 97.3 F (36.3 C), temperature source Oral, resp. rate 17, height 5\' 10"  (1.778 m), weight 58.968 kg (130 lb), SpO2 96.00%.Body mass index is 18.65 kg/(m^2).  General Appearance: Disheveled  Eye Contact::  Minimal  Speech:  Slow and not spontaneous  Volume:  decrease, hardly audible  Mood:  Depressed, irritable  Affect:  Restricted  Thought Process:  Coherent and Goal Directed  Orientation:  Other:  place, person   Thought Content:  no spontaneous content  Suicidal Thoughts:  Yes.  with intent/plan  Homicidal Thoughts:  No  Memory:  Immediate;   Fair Recent;   Fair Remote;   Fair  Judgement:  Poor  Insight:  Lacking  Psychomotor Activity:  Restlessness  Concentration:  Poor  Recall:  Poor  Fund of Knowledge:NA  Language: Fair  Akathisia:  No  Handed:    AIMS (if indicated):     Assets:  Others:  none at this time  Sleep:  Number of Hours: 6.75   Musculoskeletal: Strength & Muscle Tone: within normal limits Gait & Station: normal Patient leans: N/A  Current Medications: Current Facility-Administered Medications  Medication Dose Route Frequency Provider Last Rate Last Dose  . acetaminophen (TYLENOL) tablet 650 mg  650 mg Oral Q6H PRN Kerry HoughSpencer E Simon, PA-C   650 mg at 06/08/13 1841  . alum & mag hydroxide-simeth (MAALOX/MYLANTA) 200-200-20 MG/5ML suspension 30 mL  30 mL Oral Q4H PRN Kerry HoughSpencer E Simon, PA-C      . chlordiazePOXIDE (LIBRIUM) capsule 25 mg  25 mg Oral Q6H PRN Kerry HoughSpencer E Simon, PA-C      . chlordiazePOXIDE (LIBRIUM) capsule 25 mg  25 mg Oral BH-qamhs Spencer E Simon, PA-C       Followed by  . [START ON 06/10/2013] chlordiazePOXIDE (LIBRIUM) capsule 25 mg  25 mg Oral Daily Spencer E Simon, PA-C      . feeding supplement (ENSURE COMPLETE) (ENSURE COMPLETE) liquid 237 mL  237 mL Oral BID BM Lavena BullionHeather W Baron, RD   237 mL at 06/09/13 0956  . gi cocktail (Maalox,Lidocaine,Donnatal)  30 mL Oral TID PRN Rachael FeeIrving A Curley Hogen, MD   30 mL at 06/08/13  2202  . hydrOXYzine (ATARAX/VISTARIL) tablet 25 mg  25 mg Oral Q6H PRN Kerry Hough, PA-C      . loperamide (IMODIUM) capsule 2-4 mg  2-4 mg Oral PRN Kerry Hough, PA-C      . magnesium hydroxide (MILK OF MAGNESIA) suspension 30 mL  30 mL Oral Daily PRN Kerry Hough, PA-C      . mirtazapine (REMERON) tablet 15 mg  15 mg Oral QHS Sanjuana Kava, NP   15 mg at 06/08/13 2158  . multivitamin with minerals tablet 1 tablet  1 tablet Oral Daily Kerry Hough, PA-C   1 tablet at 06/08/13 1610  . nicotine (NICODERM CQ - dosed in mg/24 hours) patch 21 mg  21 mg Transdermal Q0600 Rachael Fee, MD   21 mg at 06/09/13 [phone]  . ondansetron (ZOFRAN-ODT) disintegrating tablet 4 mg  4 mg Oral Q6H PRN Kerry Hough, PA-C      . pantoprazole (PROTONIX) EC tablet 40 mg  40 mg Oral BID Rachael Fee, MD   40 mg at 06/08/13 1729  . thiamine (B-1) injection 100 mg  100 mg Intramuscular Once [employer], PA-C      . thiamine (VITAMIN B-1) tablet 100 mg  100 mg Oral Daily Kerry Hough, PA-C   100 mg at 06/08/13 0851  . traZODone (DESYREL) tablet 75 mg  75 mg Oral QHS Sanjuana Kava, NP   75 mg at 06/08/13 2158    Lab Results: No results found for this or any previous visit (from the past 48 hour(s)).  Physical Findings: AIMS: Facial and Oral Movements Muscles of Facial Expression: None, normal Lips and Perioral Area: None, normal Jaw: None, normal Tongue: None, normal,Extremity Movements Upper (arms, wrists, hands, fingers): None, normal Lower (legs, knees, ankles, toes): None, normal, Trunk Movements Neck, shoulders, hips: None, normal, Overall Severity Severity of abnormal movements (highest score from questions above): None, normal Incapacitation due to abnormal movements: None, normal Patient's awareness of abnormal movements (rate only patient's report): No Awareness, Dental Status Current problems with teeth and/or dentures?: No Does patient usually wear dentures?: No  CIWA:  CIWA-Ar Total: 6 COWS:  COWS Total Score: 3  Treatment Plan Summary: Daily contact with patient to assess and evaluate symptoms and progress in treatment Medication management  Plan: Supportive approach/coping skills/relapse prevention           Encourage to take the medications           CBT           asked to come to staff if he feels the urge to hurt himself           Continue Remeron increase to 30 mg (depression, sleep, appetite, anxiety)  Medical  Decision Making Problem Points:  Review of psycho-social stressors (1) Data Points:  Review of new medications or change in dosage (2)  I certify that inpatient services furnished can reasonably be expected to improve the patient's condition.   Srinika Delone A 06/09/2013, 1:46 PM

## 2013-06-09 NOTE — Progress Notes (Signed)
Recreation Therapy Notes  Animal-Assisted Activity/Therapy (AAA/T) Program Checklist/Progress Notes Patient Eligibility Criteria Checklist & Daily Group note for Rec Tx Intervention  Date: 02.12.2015 Time: 2:45pm Location: [address] Day[address]    AAA/T Program Assumption of Risk Form signed by Patient/ or Parent Legal Guardian yes  Patient is free of allergies or sever asthma yes  Patient reports no fear of animals yes  Patient reports no history of cruelty to animals yes   Patient understands his/her participation is voluntary yes  Behavioral Response: Did not attend.   Fatou Dunnigan L Daneshia Tavano, LRT/CTRS  Maronda Caison L 06/09/2013 5:28 PM 

## 2013-06-09 NOTE — Progress Notes (Signed)
Pt has been in bed all day except for meals.  He refused his 0800 meds he did take his ensure at 1000.  He told Dr. Dub MikesLugo that he would take his medications around 1400 but when he came to the window he again refused the librium.  He appeared angry and stated,"just give me my GI cocktail and something for my pain" he did take his ensure. He has stated a few times "I just want to die" He has thoughts of dying and has no plan to harm self here in hospital and contracts to come to staff. He rated both his depression, hopelessness a 10 and his anxiety 8 on his self-inventory.

## 2013-06-09 NOTE — Progress Notes (Signed)
Recreation Therapy Notes  Date: 02.11.2015 Time: 2:45pm Location: [address]   Group Topic: Anger Management  Goal Area(s) Addresses:  Patient will identify body's physical reaction to anger.  Patient will identify positive coping mechanisms to deal with anger.  Patient will select one coping mechanism of choice to use post d/c when experiencing anger.   Behavioral Response: Did not attend.    Marykay Lexenise L Cailey Trigueros, LRT/CTRS   Derward Marple L 06/09/2013 9:09 AM

## 2013-06-09 NOTE — Progress Notes (Signed)
The focus of this group is to educate the patient on the purpose and policies of crisis stabilization and provide a format to answer questions about their admission.  The group details unit policies and expectations of patients while admitted.  Pt did not attend group 

## 2013-06-09 NOTE — [facility] Group Notes (Signed)
[facility] LCSW Group Therapy  06/09/2013 3:07 PM  Type of Therapy:  Group Therapy   Participation Level:  Did Not Attend-pt in room resting/refused to attend group.   Smart, Yanky Vanderburg LCSWA  06/09/2013, 3:07 PM

## 2013-06-10 NOTE — Progress Notes (Signed)
Patient ID: Bryan Myers, male   DOB: [birth year], 43 y.o.   MRN: [id number] D: Patient in room sleeping on approach. Pt has been sleeping all evening except to come get his medications. Pt refuse to attend group basically wants to be left alone. Pt mood and affect is depressed sad, and flat. Pt endorses passive suicidal ideation but contracts for safety. Pt denies HI/AVH and pain. Pt denies any needs or concerns.  Cooperative with assessment. No acute distressed noted at this time.   A: Met with pt 1:1. Medications administered as prescribed. Writer encouraged pt to discuss feelings. Pt encouraged to come to staff with any question or concerns.   R: Patient remains safe. He is complaint with medications and denies any adverse reaction. Continue current POC.

## 2013-06-10 NOTE — Progress Notes (Signed)
Psychoeducational Group Note  Date:  06/09/2013 Time:  2100  Group Topic/Focus:  wrap up group  Participation Level: Did Not Attend  Participation Quality:  Not Applicable  Affect:  Not Applicable  Cognitive:  Not Applicable  Insight:  Not Applicable  Engagement in Group: Not Applicable  Additional Comments:  Pt remained in bed during group.  Shelah LewandowskySquires, Aaleigha Bozza Carol 06/10/2013, 1:54 AM

## 2013-06-10 NOTE — Progress Notes (Signed)
Pt attended AA meeting.  

## 2013-06-10 NOTE — Progress Notes (Signed)
[facility]  06/10/2013 2:10 PM Bryan Myers  MRN:  [id number] Subjective:  Bryan Myers endorses that he is still feeling down, depressed, hurts all over and still feels suicidal. He is on the other hand agreeable to go to a rehab program what he was not before. He still has no appetite and tries to make himself eat couple of bites here and there. He is tolerating the Ensure Diagnosis:   DSM5: Schizophrenia Disorders:  none Obsessive-Compulsive Disorders:  none Trauma-Stressor Disorders:  Posttraumatic Stress Disorder (309.81) Substance/Addictive Disorders:  Alcohol Related Disorder - Severe (303.90) Depressive Disorders:  Major Depressive Disorder - Severe (296.23) Total Time spent with patient: 30 minutes  Axis I: Substance Induced Mood Disorder  ADL's:  Intact  Sleep: Fair  Appetite:  Poor  Suicidal Ideation:  Plan:  denies Intent:  denies Means:  denies Homicidal Ideation:  Plan:  denies Intent:  denies Means:  denies AEB (as evidenced by):  Psychiatric Specialty Exam: Physical Exam  Review of Systems  Constitutional: Positive for malaise/fatigue.  HENT: Negative.   Eyes: Negative.   Respiratory: Negative.   Cardiovascular: Negative.   Gastrointestinal: Positive for heartburn.  Genitourinary: Negative.   Musculoskeletal: Positive for myalgias.  Skin: Negative.   Neurological: Positive for weakness.  Endo/Heme/Allergies: Negative.   Psychiatric/Behavioral: Positive for depression, suicidal ideas and substance abuse. The patient is nervous/anxious.     Blood pressure 113/78, pulse 108, temperature 97.2 F (36.2 C), temperature source Oral, resp. rate 16, height 5\' 10"  (1.778 m), weight 58.968 kg (130 lb), SpO2 96.00%.Body mass index is 18.65 kg/(m^2).  General Appearance: Disheveled  Eye Contact::  Minimal  Speech:  Clear and Coherent, Slow and not spontaneous  Volume:  Decreased  Mood:  Anxious and Depressed  Affect:  Restricted  Thought Process:  Coherent  and Goal Directed  Orientation:  Other:  person place  Thought Content:  sympotms, no spontaneous content, answers what he is asked for  Suicidal Thoughts:  Yes.  without intent/plan  Homicidal Thoughts:  No  Memory:  Immediate;   Fair Recent;   Fair Remote;   Fair  Judgement:  Fair  Insight:  Shallow  Psychomotor Activity:  Restlessness  Concentration:  Fair  Recall:  Poor  Fund of Knowledge:NA  Language: Fair  Akathisia:  No  Handed:    AIMS (if indicated):     Assets:  Desire for Improvement  Sleep:  Number of Hours: 5.5   Musculoskeletal: Strength & Muscle Tone: within normal limits Gait & Station: normal Patient leans: N/A  Current Medications: Current Facility-Administered Medications  Medication Dose Route Frequency Provider Last Rate Last Dose  . acetaminophen (TYLENOL) tablet 650 mg  650 mg Oral Q6H PRN Kerry HoughSpencer E Simon, PA-C   650 mg at 06/09/13 1352  . alum & mag hydroxide-simeth (MAALOX/MYLANTA) 200-200-20 MG/5ML suspension 30 mL  30 mL Oral Q4H PRN Kerry HoughSpencer E Simon, PA-C      . feeding supplement (ENSURE COMPLETE) (ENSURE COMPLETE) liquid 237 mL  237 mL Oral BID BM Lavena BullionHeather W Baron, RD   237 mL at 06/09/13 1350  . gi cocktail (Maalox,Lidocaine,Donnatal)  30 mL Oral TID PRN Rachael FeeIrving A Rori Goar, MD   30 mL at 06/09/13 2237  . magnesium hydroxide (MILK OF MAGNESIA) suspension 30 mL  30 mL Oral Daily PRN Kerry HoughSpencer E Simon, PA-C      . mirtazapine (REMERON) tablet 30 mg  30 mg Oral QHS Rachael FeeIrving A Rayon Mcchristian, MD   30 mg at 06/09/13 2236  .  multivitamin with minerals tablet 1 tablet  1 tablet Oral Daily Kerry Hough, PA-C   1 tablet at 06/10/13 1610  . nicotine (NICODERM CQ - dosed in mg/24 hours) patch 21 mg  21 mg Transdermal Q0600 Rachael Fee, MD   21 mg at 06/10/13 0825  . pantoprazole (PROTONIX) EC tablet 40 mg  40 mg Oral BID Rachael Fee, MD   40 mg at 06/10/13 9604  . thiamine (B-1) injection 100 mg  100 mg Intramuscular Once [employer], PA-C      . thiamine (VITAMIN B-1)  tablet 100 mg  100 mg Oral Daily Kerry Hough, PA-C   100 mg at 06/10/13 5409  . traZODone (DESYREL) tablet 75 mg  75 mg Oral QHS Sanjuana Kava, NP   75 mg at 06/08/13 2158    Lab Results: No results found for this or any previous visit (from the past 48 hour(s)).  Physical Findings: AIMS: Facial and Oral Movements Muscles of Facial Expression: None, normal Lips and Perioral Area: None, normal Jaw: None, normal Tongue: None, normal,Extremity Movements Upper (arms, wrists, hands, fingers): None, normal Lower (legs, knees, ankles, toes): None, normal, Trunk Movements Neck, shoulders, hips: None, normal, Overall Severity Severity of abnormal movements (highest score from questions above): None, normal Incapacitation due to abnormal movements: None, normal Patient's awareness of abnormal movements (rate only patient's report): No Awareness, Dental Status Current problems with teeth and/or dentures?: No Does patient usually wear dentures?: No  CIWA:  CIWA-Ar Total: 2 COWS:  COWS Total Score: 3  Treatment Plan Summary: Daily contact with patient to assess and evaluate symptoms and progress in treatment Medication management  Plan: Supportive approach/coping skills/relapse prevention           Continue Remeron 30 mg HS and reassess Medical Decision Making Problem Points:  Review of psycho-social stressors (1) Data Points:  Review of medication regiment & side effects (2)  I certify that inpatient services furnished can reasonably be expected to improve the patient's condition.   Alrick Cubbage A 06/10/2013, 2:10 PM

## 2013-06-10 NOTE — Progress Notes (Signed)
Adult Psychoeducational Group Note  Date:  06/10/2013 Time:  11:22 AM  Group Topic/Focus:  Building Self Esteem:   The Focus of this group is helping patients become aware of the effects of self-esteem on their lives, the things they and others do that enhance or undermine their self-esteem, seeing the relationship between their level of self-esteem and the choices they make and learning ways to enhance self-esteem. Self Esteem Action Plan:   The focus of this group is to help patients create a plan to continue to build self-esteem after discharge.  Participation Level:  Did not attend  Modes of Intervention:  Discussion, Socialization and Support  Additional Comments:  Pts discussed personal experiences of what depletes their self-esteem and what has increased self-esteem in their lives. Pt was in bed at the time of group. Caswell CorwinOwen, Aalijah Mims C 06/10/2013, 11:22 AM

## 2013-06-10 NOTE — [facility] Group Notes (Signed)
[facility] LCSW Group Therapy  06/10/2013 3:09 PM  Type of Therapy:  Group Therapy  Participation Level:  Did Not Attend-pt in room/stated that he was sleepy/refused to attend group.   Smart, Bryan Myers LCSWA  06/10/2013, 3:09 PM

## 2013-06-10 NOTE — Progress Notes (Signed)
D: Patient denies SI/HI or AVH.  Pt. Has a depressed mood and flat affect.  His interaction is minimal and he is not attending groups.  Pt. Reports that he is attempting to increase his appetite and states his sleep is good.   Pt. Secludes to his room for the majority of the day.  A: Patient given emotional support from RN. Patient encouraged to come to staff with concerns and/or questions. Patient's medication routine continued. Patient's orders and plan of care reviewed.   R: Patient remains appropriate and cooperative. Will continue to monitor patient q15 minutes for safety.

## 2013-06-10 NOTE — [facility] Group Notes (Signed)
[facility]   06/10/2013 9:52 AM  Participation Quality:  DID NOT ATTEND-pt in bed resting/refused to attend group.   Smart, American FinancialHeather LCSWA

## 2013-06-11 DIAGNOSIS — F4310 Post-traumatic stress disorder, unspecified: Secondary | ICD-10-CM

## 2013-06-11 DIAGNOSIS — F322 Major depressive disorder, single episode, severe without psychotic features: Secondary | ICD-10-CM

## 2013-06-11 DIAGNOSIS — F1994 Other psychoactive substance use, unspecified with psychoactive substance-induced mood disorder: Secondary | ICD-10-CM

## 2013-06-11 DIAGNOSIS — F1020 Alcohol dependence, uncomplicated: Principal | ICD-10-CM

## 2013-06-11 NOTE — [facility] Group Notes (Signed)
[facility] Group Notes: (Clinical Social Work)   06/11/2013      Type of Therapy:  Group Therapy   Participation Level:  Did Not Attend    Ambrose MantleMareida Grossman-Orr, LCSW 06/11/2013, 12:51 PM

## 2013-06-11 NOTE — Progress Notes (Signed)
Patient ID: Bryan Myers, male   DOB: 1970-08-15, 43 y.o.   MRN: [id number] [facility] MD Progress Note  06/11/2013 12:49 PM Oniel Meleski  MRN:  [id number] Subjective:   Patient states "It's the same old thing. I am very depressed. My kids don't want me and my parents are dead. I've been coping with alcohol. I really have nothing to live for anymore."   Objective:  Patient continues to appear severely depressed. He endorses multiple symptoms of depression today including low energy and fatigue. Patient rates his depression and hopelessness at ten. He reports drinking a twelve pack of beer daily for years. Patient reports a goal of wanting to get his license back but has not taken any steps to achieve this. [name] reports having thoughts of still wanting to hang himself but is able to contract for his safety.   Diagnosis:   DSM5: Schizophrenia Disorders:  none Obsessive-Compulsive Disorders:  none Trauma-Stressor Disorders:  Posttraumatic Stress Disorder (309.81) Substance/Addictive Disorders:  Alcohol Related Disorder - Severe (303.90) Depressive Disorders:  Major Depressive Disorder - Severe (296.23) Total Time spent with patient: 30 minutes  Axis I: Substance Induced Mood Disorder  ADL's:  Intact  Sleep: Fair  Appetite:  Poor  Suicidal Ideation:  Plan:  denies Intent:  denies Means:  denies Homicidal Ideation:  Plan:  denies Intent:  denies Means:  denies AEB (as evidenced by):  Psychiatric Specialty Exam: Physical Exam  Review of Systems  Constitutional: Positive for malaise/fatigue.  HENT: Negative.   Eyes: Negative.   Respiratory: Negative.   Cardiovascular: Negative.   Gastrointestinal: Positive for heartburn.  Genitourinary: Negative.   Musculoskeletal: Positive for myalgias.  Skin: Negative.   Neurological: Negative.   Endo/Heme/Allergies: Negative.   Psychiatric/Behavioral: Positive for depression, suicidal ideas and substance abuse. Negative for hallucinations and  memory loss. The patient is nervous/anxious. The patient does not have insomnia.     Blood pressure 83/40, pulse 56, temperature 97.4 F (36.3 C), temperature source Oral, resp. rate 18, height 5\' 10"  (1.778 m), weight 58.968 kg (130 lb), SpO2 96.00%.Body mass index is 18.65 kg/(m^2).  General Appearance: Disheveled  Eye Contact::  Minimal  Speech:  Clear and Coherent, Slow and not spontaneous  Volume:  Decreased  Mood:  Anxious and Depressed  Affect:  Restricted  Thought Process:  Coherent and Goal Directed  Orientation:  Other:  person place  Thought Content:  sympotms, no spontaneous content, answers what he is asked for  Suicidal Thoughts:  Yes.  with intent/plan  Homicidal Thoughts:  No  Memory:  Immediate;   Fair Recent;   Fair Remote;   Fair  Judgement:  Fair  Insight:  Shallow  Psychomotor Activity:  Decreased  Concentration:  Fair  Recall:  Poor  Fund of Knowledge:NA  Language: Fair  Akathisia:  No  Handed:    AIMS (if indicated):     Assets:  Desire for Improvement Resilience  Sleep:  Number of Hours: 6.75   Musculoskeletal: Strength & Muscle Tone: within normal limits Gait & Station: normal Patient leans: N/A  Current Medications: Current Facility-Administered Medications  Medication Dose Route Frequency Provider Last Rate Last Dose  . acetaminophen (TYLENOL) tablet 650 mg  650 mg Oral Q6H PRN Kerry Hough, PA-C   650 mg at 06/09/13 1352  . alum & mag hydroxide-simeth (MAALOX/MYLANTA) 200-200-20 MG/5ML suspension 30 mL  30 mL Oral Q4H PRN Kerry Hough, PA-C      . feeding supplement (ENSURE COMPLETE) (ENSURE COMPLETE) liquid 237 mL  237 mL Oral BID BM Lavena BullionHeather W Baron, RD   237 mL at 06/10/13 2205  . gi cocktail (Maalox,Lidocaine,Donnatal)  30 mL Oral TID PRN Rachael FeeIrving A Lugo, MD   30 mL at 06/11/13 0745  . magnesium hydroxide (MILK OF MAGNESIA) suspension 30 mL  30 mL Oral Daily PRN Kerry HoughSpencer E Simon, PA-C      . mirtazapine (REMERON) tablet 30 mg  30 mg Oral  QHS Rachael FeeIrving A Lugo, MD   30 mg at 06/10/13 2202  . multivitamin with minerals tablet 1 tablet  1 tablet Oral Daily Kerry HoughSpencer E Simon, PA-C   1 tablet at 06/11/13 0745  . nicotine (NICODERM CQ - dosed in mg/24 hours) patch 21 mg  21 mg Transdermal Q0600 Rachael FeeIrving A Lugo, MD   21 mg at 06/11/13 [phone]  . pantoprazole (PROTONIX) EC tablet 40 mg  40 mg Oral BID Rachael FeeIrving A Lugo, MD   40 mg at 06/11/13 0745  . thiamine (B-1) injection 100 mg  100 mg Intramuscular Once IntelSpencer E Simon, PA-C      . thiamine (VITAMIN B-1) tablet 100 mg  100 mg Oral Daily Kerry HoughSpencer E Simon, PA-C   100 mg at 06/11/13 0745  . traZODone (DESYREL) tablet 75 mg  75 mg Oral QHS Sanjuana KavaAgnes I Nwoko, NP   75 mg at 06/10/13 2202    Lab Results: No results found for this or any previous visit (from the past 48 hour(s)).  Physical Findings: AIMS: Facial and Oral Movements Muscles of Facial Expression: None, normal Lips and Perioral Area: None, normal Jaw: None, normal Tongue: None, normal,Extremity Movements Upper (arms, wrists, hands, fingers): None, normal Lower (legs, knees, ankles, toes): None, normal, Trunk Movements Neck, shoulders, hips: None, normal, Overall Severity Severity of abnormal movements (highest score from questions above): None, normal Incapacitation due to abnormal movements: None, normal Patient's awareness of abnormal movements (rate only patient's report): No Awareness, Dental Status Current problems with teeth and/or dentures?: No Does patient usually wear dentures?: No  CIWA:  CIWA-Ar Total: 4 COWS:  COWS Total Score: 3  Treatment Plan Summary: Daily contact with patient to assess and evaluate symptoms and progress in treatment Medication management  Plan:  Continue crisis management and stabilization.  Medication management: Continue Remeron 30 mg at hs for depression/appetite stimulation/insomnia.  Encouraged patient to attend groups and participate in group counseling sessions and activities.  Discharge  plan in progress. [facility] he has completed the [facility] detox protocol.  Continue current treatment plan.  Address health issues: Vitals reviewed and stable. Continue Protonix 40 mg daily for acid reflux symptoms.   Medical Decision Making Problem Points:  Established problem, stable/improving (1) and Review of psycho-social stressors (1) Data Points:  Review of medication regiment & side effects (2) I certify that inpatient services furnished can reasonably be expected to improve the patient's condition.   Fransisca KaufmannDAVIS, LAURA NP-C 06/11/2013, 12:49 PM  Patient seen, evaluated and I agree with notes by Nurse Practitioner. Thedore MinsMojeed Tonisha Silvey, MD

## 2013-06-11 NOTE — Progress Notes (Signed)
Patient ID: Bryan Myers, male   DOB: 03/29/1971, 43 y.o.   MRN: [id number] D: Patient has been out and about on the unit this evening. Pt states "I will give you all the money i have if you give me 6 feet rope" when asked if suicidal. Pt mood and affect is depressed sad, and flat. Pt attended evening AA group and engaged in discussions. Pt endorses passive suicidal ideation but contracts for safety. Pt denies HI/AVH and pain. Pt denies any needs or concerns.  Cooperative with assessment. No acute distressed noted at this time.   A: Met with pt 1:1. Medications administered as prescribed. Writer encouraged pt to discuss feelings. Pt encouraged to come to staff with any question or concerns.   R: Patient remains safe. He is complaint with medications and denies any adverse reaction. Continue current POC.  

## 2013-06-11 NOTE — [facility] Group Notes (Signed)
[facility] Group Notes:  (Nursing/MHT/Case Management/Adjunct)  Date:  06/11/2013  Time:  9:56 AM Psychoeducational Group Note  Date:  06/11/2013 Time:  0956   Group Topic/Focus:  Healthy Communication:   The focus of this group is to discuss communication, barriers to communication, as well as healthy ways to communicate with others.  Participation Level: Did Not Attend  Participation Quality:  Not Applicable  Affect:  Not Applicable  Cognitive:  Not Applicable  Insight:  Not Applicable  Engagement in Group: Not Applicable  Additional Comments:    Renaee MundaSadler, Lekesha Claw Thomas 06/11/2013, 9:57 AM

## 2013-06-11 NOTE — Progress Notes (Signed)
[facility] Group Notes:  (Nursing/MHT/Case Management/Adjunct)  Date:  06/11/2013  Time:  2100  Type of Therapy:  wrap up group  Participation Level:  Active  Participation Quality:  Appropriate, Attentive and Supportive  Affect:  Flat  Cognitive:  Appropriate  Insight:  Lacking  Engagement in Group:  Engaged  Modes of Intervention:  Clarification, Education and Support  Summary of Progress/Problems: Pt reported having constant suicidal thoughts today. Pt was told to come to staff if thoughts got to be too much. Pt contracted for safety. Pt shares that he wants to go to rehab and is open to anyone anywhere.   Shelah LewandowskySquires, Melany Wiesman Carol 06/11/2013, 10:47 PM

## 2013-06-11 NOTE — Progress Notes (Signed)
Patient ID: Bryan Myers, male   DOB: 1971-01-23, 43 y.o.   MRN: [id number]  D: Patient with dull, flat affect endorsing depression. Pt agitated and irritable when assessed.  A: Q 15 minute safety checks, encourage staff/peer interaction and group participation. Administer medications as ordered by MD.  R: Pt compliant with medications but did not attend group session. Pt denies SI or plans to harm himself at this time.

## 2013-06-12 DIAGNOSIS — R45851 Suicidal ideations: Secondary | ICD-10-CM

## 2013-06-12 MED ORDER — MIRTAZAPINE 30 MG PO TABS
45.0000 mg | ORAL_TABLET | Freq: Every day | ORAL | Status: DC
  Administered 2013-06-12 – 2013-06-13 (×2): 45 mg via ORAL
  Filled 2013-06-12: qty 21
  Filled 2013-06-12 (×3): qty 1

## 2013-06-12 NOTE — Progress Notes (Signed)
Patient ID: Bryan Myers, male   DOB: February 24, 1971, 43 y.o.   MRN: [id number]  D: Patient withdrawn and secluded to his room for much of the shift. Pt with dull, flat affect endorsing depression.  A: Q 15 minute safety checks, encourage staff/peer interaction and group participation. Administrate medications as ordered by MD.  R: Patient denies SI or plans to harm himself at this time.

## 2013-06-12 NOTE — Progress Notes (Signed)
[facility] Group Notes:  (Nursing/MHT/Case Management/Adjunct)  Date:  06/12/2013  Time:  6:28 PM  Type of Therapy:  Psychoeducational Skills  Participation Level:  Active  Participation Quality:  Appropriate and Attentive  Affect:  Appropriate  Cognitive:  Appropriate  Insight:  Appropriate  Engagement in Group:  Engaged and Supportive  Modes of Intervention:  Activity  Summary of Progress/Problems: Pts played an activity of Pictionary using coping skills. Pts stated one coping skill they have learned while they have been in the hospital that they will use outside of the hospital.    Sweetie Giebler C 06/12/2013, 6:28 PM 

## 2013-06-12 NOTE — [facility] Group Notes (Signed)
[facility] Group Notes: (Clinical Social Work)   06/12/2013      Type of Therapy:  Group Therapy   Participation Level:  Did Not Attend - came in several times, but left   Bryan MantleMareida Grossman-Orr, LCSW 06/12/2013, 12:53 PM

## 2013-06-12 NOTE — [facility] Group Notes (Signed)
[facility] Group Notes:  (Nursing/MHT/Case Management/Adjunct)  Date:  06/12/2013  Time:  2:01 PM  Type of Therapy:  Nurse Education  Participation Level:  Active  Participation Quality:  Appropriate and Attentive  Affect:  Appropriate  Cognitive:  Alert and Appropriate  Insight:  Appropriate and Good  Engagement in Group:  Engaged  Modes of Intervention:  Discussion and Education  Summary of Progress/Problems: Video on "Dedicating Yourself To Sobriety". Discussion.    Renaee MundaSadler, Luvina Poirier Thomas 06/12/2013, 2:01 PM

## 2013-06-12 NOTE — Progress Notes (Signed)
Patient ID: Bryan Myers, male   DOB: 1970-11-01, 43 y.o.   MRN: [id number] [facility] MD Progress Note  06/12/2013 11:12 AM Bryan Myers  MRN:  [id number] Subjective:   Patient states "My depression is the same it is a ten. I have constant thoughts to hang myself. It's no use talking about changing anything because it's all hopeless. There is no hope of getting my driver's license back because I don't have any money. I don't want to get my hopes up about anything."   Objective:  Patient continues to appear severely depressed. He endorses multiple symptoms of depression today including low energy and fatigue. Patient rates his depression and hopelessness at ten. He is unable to contract for his safety outside the hospital reporting previous attempt of trying to hang himself. Patient makes no eye contact during interaction with Provider. He become irritable when asked about what changes he could plan to improve the quality of his life. Bryan Myers is quick to let this Clinical research associate know that his situation is hopeless and there is no reason to discuss getting better.   Diagnosis:   DSM5: Schizophrenia Disorders:  none Obsessive-Compulsive Disorders:  none Trauma-Stressor Disorders:  Posttraumatic Stress Disorder (309.81) Substance/Addictive Disorders:  Alcohol Related Disorder - Severe (303.90) Depressive Disorders:  Major Depressive Disorder - Severe (296.23) Total Time spent with patient: 30 minutes  Axis I: Substance Induced Mood Disorder  ADL's:  Intact  Sleep: Fair  Appetite:  Poor  Suicidal Ideation:  SI to hang self but contracts for safety in the hospital  Homicidal Ideation:  Plan:  denies Intent:  denies Means:  denies AEB (as evidenced by):  Psychiatric Specialty Exam: Physical Exam  Review of Systems  Constitutional: Positive for malaise/fatigue.  HENT: Negative.   Eyes: Negative.   Respiratory: Negative.   Cardiovascular: Negative.   Gastrointestinal: Positive for heartburn.   Genitourinary: Negative.   Musculoskeletal: Positive for myalgias.  Skin: Negative.   Neurological: Negative.   Endo/Heme/Allergies: Negative.   Psychiatric/Behavioral: Positive for depression, suicidal ideas and substance abuse. Negative for hallucinations and memory loss. The patient is nervous/anxious. The patient does not have insomnia.     Blood pressure 101/62, pulse 76, temperature 98.6 F (37 C), temperature source Oral, resp. rate 16, height 5\' 10"  (1.778 m), weight 58.968 kg (130 lb), SpO2 96.00%.Body mass index is 18.65 kg/(m^2).  General Appearance: Disheveled  Eye Contact::  Minimal  Speech:  Clear and Coherent, Slow and not spontaneous  Volume:  Decreased  Mood:  Anxious and Depressed  Affect:  Restricted  Thought Process:  Coherent and Goal Directed  Orientation:  Other:  person place  Thought Content:  Rumination   Suicidal Thoughts:  Yes.  with intent/plan  Homicidal Thoughts:  No  Memory:  Immediate;   Fair Recent;   Fair Remote;   Fair  Judgement:  Fair  Insight:  Shallow  Psychomotor Activity:  Decreased  Concentration:  Fair  Recall:  Poor  Fund of Knowledge:NA  Language: Fair  Akathisia:  No  Handed:    AIMS (if indicated):     Assets:  Desire for Improvement Resilience  Sleep:  Number of Hours: 6.25   Musculoskeletal: Strength & Muscle Tone: within normal limits Gait & Station: normal Patient leans: N/A  Current Medications: Current Facility-Administered Medications  Medication Dose Route Frequency Provider Last Rate Last Dose  . acetaminophen (TYLENOL) tablet 650 mg  650 mg Oral Q6H PRN Kerry Hough, PA-C   650 mg at 06/11/13 1919  .  alum & mag hydroxide-simeth (MAALOX/MYLANTA) 200-200-20 MG/5ML suspension 30 mL  30 mL Oral Q4H PRN Kerry HoughSpencer E Simon, PA-C      . feeding supplement (ENSURE COMPLETE) (ENSURE COMPLETE) liquid 237 mL  237 mL Oral BID BM Lavena BullionHeather W Baron, RD   237 mL at 06/12/13 1015  . gi cocktail (Maalox,Lidocaine,Donnatal)  30  mL Oral TID PRN Rachael FeeIrving A Lugo, MD   30 mL at 06/12/13 0745  . magnesium hydroxide (MILK OF MAGNESIA) suspension 30 mL  30 mL Oral Daily PRN Kerry HoughSpencer E Simon, PA-C      . mirtazapine (REMERON) tablet 45 mg  45 mg Oral QHS Fransisca KaufmannLaura Davis, NP      . multivitamin with minerals tablet 1 tablet  1 tablet Oral Daily Kerry HoughSpencer E Simon, PA-C   1 tablet at 06/12/13 0745  . nicotine (NICODERM CQ - dosed in mg/24 hours) patch 21 mg  21 mg Transdermal Q0600 Rachael FeeIrving A Lugo, MD   21 mg at 06/12/13 1017  . pantoprazole (PROTONIX) EC tablet 40 mg  40 mg Oral BID Rachael FeeIrving A Lugo, MD   40 mg at 06/12/13 0745  . thiamine (B-1) injection 100 mg  100 mg Intramuscular Once IntelSpencer E Simon, PA-C      . thiamine (VITAMIN B-1) tablet 100 mg  100 mg Oral Daily Kerry HoughSpencer E Simon, PA-C   100 mg at 06/12/13 0747  . traZODone (DESYREL) tablet 75 mg  75 mg Oral QHS Sanjuana KavaAgnes I Nwoko, NP   75 mg at 06/11/13 2133    Lab Results: No results found for this or any previous visit (from the past 48 hour(s)).  Physical Findings: AIMS: Facial and Oral Movements Muscles of Facial Expression: None, normal Lips and Perioral Area: None, normal Jaw: None, normal Tongue: None, normal,Extremity Movements Upper (arms, wrists, hands, fingers): None, normal Lower (legs, knees, ankles, toes): None, normal, Trunk Movements Neck, shoulders, hips: None, normal, Overall Severity Severity of abnormal movements (highest score from questions above): None, normal Incapacitation due to abnormal movements: None, normal Patient's awareness of abnormal movements (rate only patient's report): No Awareness, Dental Status Current problems with teeth and/or dentures?: No Does patient usually wear dentures?: No  CIWA:  CIWA-Ar Total: 0 COWS:  COWS Total Score: 3  Treatment Plan Summary: Daily contact with patient to assess and evaluate symptoms and progress in treatment Medication management  Plan:  Continue crisis management and stabilization.  Medication  management: Increase Remeron 45 mg at hs for depression/appetite stimulation/insomnia.  Encouraged patient to attend groups and participate in group counseling sessions and activities.  Discharge plan in progress. [facility] as he has completed the librium detox protocol.  Continue current treatment plan.  Address health issues: Vitals reviewed and stable. Continue Protonix 40 mg daily for acid reflux symptoms.   Medical Decision Making Problem Points:  Established problem, stable/improving (1), Review of last therapy session (1) and Review of psycho-social stressors (1) Data Points:  Review of medication regiment & side effects (2) Review of new medications or change in dosage (2) I certify that inpatient services furnished can reasonably be expected to improve the patient's condition.   Fransisca KaufmannDAVIS, LAURA NP-C 06/12/2013, 11:12 AM   Patient seen, evaluated and I agree with notes by Nurse Practitioner. Thedore MinsMojeed Leon Montoya, MD

## 2013-06-12 NOTE — Progress Notes (Signed)
Patient observed in bed eyes closed with respirations even and unlabored.   Safety maintained, Q 15 checks continue.  

## 2013-06-12 NOTE — [facility] Group Notes (Signed)
[facility] Group Notes:  (Nursing/MHT/Case Management/Adjunct) Psychoeducational Group Note  Date:  06/12/2013 Time:  0900  Group Topic/Focus:  Relapse Prevention Planning:   The focus of this group is to define relapse and discuss the need for planning to combat relapse.  Participation Level: Did Not Attend  Participation Quality:  Not Applicable  Affect:  Not Applicable  Cognitive:  Not Applicable  Insight:  Not Applicable  Engagement in Group: Not Applicable  Additional Comments:    Renaee MundaSadler, Makyia Erxleben Thomas 06/12/2013, 9:21 AM

## 2013-06-12 NOTE — Progress Notes (Signed)
Patient did attend the evening speaker AA meeting.  

## 2013-06-13 NOTE — [facility] Group Notes (Signed)
[facility]   06/13/2013 9:36 AM  Participation Quality:  DID NOT ATTEND-pt in bed resting/refused to attend group.   Smart, American FinancialHeather LCSWA

## 2013-06-13 NOTE — Progress Notes (Signed)
[facility]  06/13/2013 12:47 PM Bryan Myers  MRN:  [id number] Subjective:  Bryan Myers endorses that he is still not where he would like to be physically or emotionally wise. He is willing to go to a rehab program. Still dealing with the depression the abdominal pain (states this is constant and in itself a trigger for the depression) he is still struggling with suicidal ideations as a way of not having to keep dealing with his issues. States he is starting to eat a little better. Sleep is still an issue Diagnosis:   DSM5: Schizophrenia Disorders:  none Obsessive-Compulsive Disorders:  none Trauma-Stressor Disorders:  Posttraumatic Stress Disorder (309.81) Substance/Addictive Disorders:  Alcohol Related Disorder - Severe (303.90) Depressive Disorders:  Major Depressive Disorder - Severe (296.23) Total Time spent with patient: 30 minutes  Axis I: Substance Induced Mood Disorder  ADL's:  Intact  Sleep: Poor  Appetite:  starting to eat better  Suicidal Ideation:  Plan:  denies Intent:  denies Means:  denies Homicidal Ideation:  Plan:  denies Intent:  denies Means:  denies AEB (as evidenced by):  Psychiatric Specialty Exam: Physical Exam  Review of Systems  Constitutional: Positive for malaise/fatigue.  HENT: Negative.   Eyes: Negative.   Respiratory: Negative.   Cardiovascular: Negative.   Gastrointestinal: Positive for abdominal pain.  Genitourinary: Negative.   Musculoskeletal: Negative.   Skin: Negative.   Neurological: Positive for weakness.  Endo/Heme/Allergies: Negative.   Psychiatric/Behavioral: Positive for depression. The patient is nervous/anxious and has insomnia.     Blood pressure 103/68, pulse 80, temperature 97.8 F (36.6 C), temperature source Oral, resp. rate 16, height 5\' 10"  (1.778 m), weight 58.968 kg (130 lb), SpO2 96.00%.Body mass index is 18.65 kg/(m^2).  General Appearance: Fairly Groomed  Patent attorneyye Contact::  Minimal  Speech:  Clear and  Coherent, Slow and not spontaneous  Volume:  Decreased  Mood:  Depressed  Affect:  Restricted  Thought Process:  Coherent and Goal Directed  Orientation:  Full (Time, Place, and Person)  Thought Content:  no spontaneous content, answers what he is asked for, deals mostly wiht symptoms, worries, concerns  Suicidal Thoughts:  intermittent  Homicidal Thoughts:  No  Memory:  Immediate;   Fair Recent;   Fair Remote;   Fair  Judgement:  Fair  Insight:  Shallow  Psychomotor Activity:  Restlessness  Concentration:  Fair  Recall:  FiservFair  Fund of Knowledge:NA  Language: Fair  Akathisia:  No  Handed:    AIMS (if indicated):     Assets:  Desire for Improvement  Sleep:  Number of Hours: 6   Musculoskeletal: Strength & Muscle Tone: within normal limits Gait & Station: normal Patient leans: N/A  Current Medications: Current Facility-Administered Medications  Medication Dose Route Frequency Provider Last Rate Last Dose  . acetaminophen (TYLENOL) tablet 650 mg  650 mg Oral Q6H PRN Kerry HoughSpencer E Simon, PA-C   650 mg at 06/13/13 [id number]  . alum & mag hydroxide-simeth (MAALOX/MYLANTA) 200-200-20 MG/5ML suspension 30 mL  30 mL Oral Q4H PRN Kerry HoughSpencer E Simon, PA-C      . feeding supplement (ENSURE COMPLETE) (ENSURE COMPLETE) liquid 237 mL  237 mL Oral BID BM Lavena BullionHeather W Baron, RD   237 mL at 06/13/13 0824  . gi cocktail (Maalox,Lidocaine,Donnatal)  30 mL Oral TID PRN Rachael FeeIrving A Talani Brazee, MD   30 mL at 06/13/13 0622  . magnesium hydroxide (MILK OF MAGNESIA) suspension 30 mL  30 mL Oral Daily PRN Kerry HoughSpencer E Simon, PA-C      .  mirtazapine (REMERON) tablet 45 mg  45 mg Oral QHS Fransisca Kaufmann, NP   45 mg at 06/12/13 2118  . multivitamin with minerals tablet 1 tablet  1 tablet Oral Daily Kerry Hough, PA-C   1 tablet at 06/13/13 1610  . nicotine (NICODERM CQ - dosed in mg/24 hours) patch 21 mg  21 mg Transdermal Q0600 Rachael Fee, MD   21 mg at 06/13/13 0617  . pantoprazole (PROTONIX) EC tablet 40 mg  40 mg Oral BID  Rachael Fee, MD   40 mg at 06/13/13 9604  . thiamine (B-1) injection 100 mg  100 mg Intramuscular Once [employer], PA-C      . thiamine (VITAMIN B-1) tablet 100 mg  100 mg Oral Daily Kerry Hough, PA-C   100 mg at 06/13/13 5409  . traZODone (DESYREL) tablet 75 mg  75 mg Oral QHS Sanjuana Kava, NP   75 mg at 06/12/13 2117    Lab Results: No results found for this or any previous visit (from the past 48 hour(s)).  Physical Findings: AIMS: Facial and Oral Movements Muscles of Facial Expression: None, normal Lips and Perioral Area: None, normal Jaw: None, normal Tongue: None, normal,Extremity Movements Upper (arms, wrists, hands, fingers): None, normal Lower (legs, knees, ankles, toes): None, normal, Trunk Movements Neck, shoulders, hips: None, normal, Overall Severity Severity of abnormal movements (highest score from questions above): None, normal Incapacitation due to abnormal movements: None, normal Patient's awareness of abnormal movements (rate only patient's report): No Awareness, Dental Status Current problems with teeth and/or dentures?: No Does patient usually wear dentures?: No  CIWA:  CIWA-Ar Total: 5 COWS:  COWS Total Score: 3  Treatment Plan Summary: Daily contact with patient to assess and evaluate symptoms and progress in treatment Medication management  Plan: Supportive approach/coping skills/relapse prevention           Reassess response to the Remeron           Consider augmenting           Explore rehab options  Medical Decision Making Problem Points:  Review of psycho-social stressors (1) Data Points:  Review of medication regiment & side effects (2)  I certify that inpatient services furnished can reasonably be expected to improve the patient's condition.   Mosella Kasa A 06/13/2013, 12:47 PM

## 2013-06-13 NOTE — Progress Notes (Signed)
The focus of this group is to help patients review their daily goal of treatment and discuss progress on daily workbooks. Pt attended the evening group and responded to all discussion prompts from the Writer. Pt shared that today was a good day on the unit, the highlight of which was going to the gym. Pt reported having no additional needs from Nursing Staff. Pt interrupted a peer in order to rapidly answer the Writer's questions, saying how he wanted to finish group in order to continue watching a movie on television. Pt's affect was anxious.

## 2013-06-13 NOTE — Progress Notes (Signed)
Patient observed in bed with eyes closed, respirations even and unlabored. Appears to be resting comfortably.  Safety maintained, Q 15 checks continue.   

## 2013-06-13 NOTE — Progress Notes (Signed)
D:Pt rates his depression as an 8 and hopelessness as a 10 on 1-10 scale with 10 being the most hopeless. Pt has a flat/ blunted affect. He reports poor sleep last night.  A:Offered support, encouragement and 15 minute checks. R:Pt denies si and hi. Safety maintained on the unit.

## 2013-06-13 NOTE — [facility] Group Notes (Signed)
[facility] LCSW Group Therapy  06/13/2013 2:34 PM  Type of Therapy:  Group Therapy  Participation Level:  Active  Participation Quality:  Attentive  Affect:  Irritable  Cognitive:  Alert  Insight:  Improving  Engagement in Therapy:  Improving  Modes of Intervention:  Confrontation, Discussion, Education, Exploration, Problem-solving, Rapport Building, Socialization and Support  Summary of Progress/Problems: Today's Topic: Overcoming Obstacles. Pt identified obstacles faced currently and processed barriers involved in overcoming these obstacles. Pt identified steps necessary for overcoming these obstacles and explored motivation (internal and external) for facing these difficulties head on. Pt further identified one area of concern in their lives and chose a skill of focus pulled from their "toolbox." Bryan Myers was attentive and engaged throughout today's therapy group. He shared that his obstacle involves isolating and not seeking help when he needs it. Bryan Myers shared an example of being easily agitated and isolating today when faced with a "difficult personality" in the cafeteria. Bryan Myers shows progress in the group setting and improving insight AEB his ability to process how [facility] help him continue on his path of sobriety and how taking medications will help him with his mental health issues.    Smart, Dabria Wadas LCSWA  06/13/2013, 2:34 PM

## 2013-06-13 NOTE — Tx Team (Signed)
Interdisciplinary Treatment Plan Update (Adult)  Date: 06/13/2013   Time Reviewed: 10:56 AM  Progress in Treatment:  Attending groups: No.  Participating in groups:  No.  Taking medication as prescribed: Yes  Tolerating medication: Yes  Family/Significant othe contact made: Pt refused to consent to family contact. SPE completed with pt.  Patient understands diagnosis: Yes, AEB seeking treatment for SI with plan, Mood stabilization, and ETOH detox. Discussing patient identified problems/goals with staff: Yes  Medical problems stabilized or resolved: Yes  Denies suicidal/homicidal ideation: Yes, self report.  Patient has not harmed self or Others: Yes  New problem(s) identified:  Discharge Plan or Barriers: [facility] Admission scheduled for 2/19 Thursday. Pt [facility]. [facility].  Additional comments: n/a  Reason for Continuation of Hospitalization: Mood stabilization Medication management  Estimated length of stay: 1-3 days  For review of initial/current patient goals, please see plan of care.  Attendees:  Patient:    Family:    Physician: Geoffery LyonsIrving Lugo MD 06/13/2013 10:56 AM   Nursing: Lupita Leashonna RN 06/13/2013 10:56 AM   Clinical Social Worker Larrisa Cravey Smart, LCSWA  06/13/2013 10:56 AM   Other: Jan RN  06/13/2013 10:56 AM   Other: Victorino DikeJennifer RN  06/13/2013 10:56 AM   Other: Darden DatesJennifer C. Nurse CM  06/13/2013 10:56 AM   Other: Chandra BatchAggie N. PA 06/13/2013 10:56 AM   Scribe for Treatment Team:  Trula SladeHeather Smart LCSWA 06/13/2013 10:56 AM

## 2013-06-14 ENCOUNTER — Inpatient Hospital Stay (HOSPITAL_COMMUNITY): Payer: 59 | Admitting: Anesthesiology

## 2013-06-14 ENCOUNTER — Inpatient Hospital Stay (HOSPITAL_COMMUNITY): Payer: 59

## 2013-06-14 ENCOUNTER — Observation Stay (HOSPITAL_COMMUNITY)
Admit: 2013-06-14 | Discharge: 2013-06-16 | Disposition: A | Discharge status: Home / Self Care | Payer: 59 | Attending: General Surgery | Admitting: General Surgery

## 2013-06-14 ENCOUNTER — Encounter (HOSPITAL_COMMUNITY)
Admission: EM | Disposition: A | Discharge status: Other Acute Inpatient Hospital | Payer: Self-pay | Source: Intra-hospital | Attending: Psychiatry

## 2013-06-14 ENCOUNTER — Encounter (HOSPITAL_COMMUNITY): Payer: 59 | Admitting: Anesthesiology

## 2013-06-14 ENCOUNTER — Encounter (HOSPITAL_COMMUNITY): Payer: Self-pay | Admitting: Emergency Medicine

## 2013-06-14 DIAGNOSIS — Z5900 Homelessness unspecified: Secondary | ICD-10-CM | POA: Insufficient documentation

## 2013-06-14 DIAGNOSIS — F209 Schizophrenia, unspecified: Secondary | ICD-10-CM | POA: Insufficient documentation

## 2013-06-14 DIAGNOSIS — F329 Major depressive disorder, single episode, unspecified: Secondary | ICD-10-CM

## 2013-06-14 DIAGNOSIS — J4489 Other specified chronic obstructive pulmonary disease: Secondary | ICD-10-CM | POA: Insufficient documentation

## 2013-06-14 DIAGNOSIS — F322 Major depressive disorder, single episode, severe without psychotic features: Secondary | ICD-10-CM | POA: Insufficient documentation

## 2013-06-14 DIAGNOSIS — K4030 Unilateral inguinal hernia, with obstruction, without gangrene, not specified as recurrent: Secondary | ICD-10-CM | POA: Diagnosis present

## 2013-06-14 DIAGNOSIS — F1020 Alcohol dependence, uncomplicated: Secondary | ICD-10-CM | POA: Insufficient documentation

## 2013-06-14 DIAGNOSIS — F1994 Other psychoactive substance use, unspecified with psychoactive substance-induced mood disorder: Secondary | ICD-10-CM | POA: Insufficient documentation

## 2013-06-14 DIAGNOSIS — F4310 Post-traumatic stress disorder, unspecified: Secondary | ICD-10-CM

## 2013-06-14 DIAGNOSIS — J449 Chronic obstructive pulmonary disease, unspecified: Secondary | ICD-10-CM | POA: Insufficient documentation

## 2013-06-14 DIAGNOSIS — F429 Obsessive-compulsive disorder, unspecified: Secondary | ICD-10-CM | POA: Insufficient documentation

## 2013-06-14 DIAGNOSIS — M129 Arthropathy, unspecified: Secondary | ICD-10-CM | POA: Insufficient documentation

## 2013-06-14 DIAGNOSIS — F17200 Nicotine dependence, unspecified, uncomplicated: Secondary | ICD-10-CM | POA: Insufficient documentation

## 2013-06-14 DIAGNOSIS — K219 Gastro-esophageal reflux disease without esophagitis: Secondary | ICD-10-CM | POA: Insufficient documentation

## 2013-06-14 HISTORY — PX: INSERTION OF MESH: SHX5868

## 2013-06-14 HISTORY — PX: INGUINAL HERNIA REPAIR: SHX194

## 2013-06-14 LAB — CBC WITH DIFFERENTIAL/PLATELET
Basophils Absolute: 0 10*3/uL (ref 0.0–0.1)
Basophils Relative: 0 % (ref 0–1)
EOS ABS: 0.2 10*3/uL (ref 0.0–0.7)
Eosinophils Relative: 3 % (ref 0–5)
HCT: 40.5 % (ref 39.0–52.0)
Hemoglobin: 13.4 g/dL (ref 13.0–17.0)
Lymphocytes Relative: 27 % (ref 12–46)
Lymphs Abs: 1.8 10*3/uL (ref 0.7–4.0)
MCH: 31.3 pg (ref 26.0–34.0)
MCHC: 33.1 g/dL (ref 30.0–36.0)
MCV: 94.6 fL (ref 78.0–100.0)
Monocytes Absolute: 0.9 10*3/uL (ref 0.1–1.0)
Monocytes Relative: 14 % — ABNORMAL HIGH (ref 3–12)
NEUTROS PCT: 56 % (ref 43–77)
Neutro Abs: 3.8 10*3/uL (ref 1.7–7.7)
PLATELETS: 241 10*3/uL (ref 150–400)
RBC: 4.28 MIL/uL (ref 4.22–5.81)
RDW: 15 % (ref 11.5–15.5)
WBC: 6.8 10*3/uL (ref 4.0–10.5)

## 2013-06-14 LAB — URINALYSIS, ROUTINE W REFLEX MICROSCOPIC
Bilirubin Urine: NEGATIVE
Glucose, UA: NEGATIVE mg/dL
HGB URINE DIPSTICK: NEGATIVE
Ketones, ur: NEGATIVE mg/dL
Leukocytes, UA: NEGATIVE
Nitrite: NEGATIVE
PROTEIN: NEGATIVE mg/dL
Specific Gravity, Urine: 1.005 (ref 1.005–1.030)
Urobilinogen, UA: 0.2 mg/dL (ref 0.0–1.0)
pH: 7 (ref 5.0–8.0)

## 2013-06-14 LAB — COMPREHENSIVE METABOLIC PANEL
ALT: 49 U/L (ref 0–53)
AST: 44 U/L — ABNORMAL HIGH (ref 0–37)
Albumin: 3.6 g/dL (ref 3.5–5.2)
Alkaline Phosphatase: 117 U/L (ref 39–117)
BUN: 14 mg/dL (ref 6–23)
CHLORIDE: 101 meq/L (ref 96–112)
CO2: 29 meq/L (ref 19–32)
Calcium: 9.8 mg/dL (ref 8.4–10.5)
Creatinine, Ser: 0.9 mg/dL (ref 0.50–1.35)
GFR calc Af Amer: >90 mL/min (ref >90)
Glucose, Bld: 88 mg/dL (ref 70–99)
Potassium: 4.5 mEq/L (ref 3.7–5.3)
SODIUM: 141 meq/L (ref 137–147)
Total Protein: 7.1 g/dL (ref 6.0–8.3)

## 2013-06-14 LAB — SURGICAL PCR SCREEN
MRSA, PCR: NEGATIVE
Staphylococcus aureus: NEGATIVE

## 2013-06-14 LAB — LIPASE, BLOOD: Lipase: 41 U/L (ref 11–59)

## 2013-06-14 SURGERY — REPAIR, HERNIA, INGUINAL, INCARCERATED
Anesthesia: General | Site: Groin | Laterality: Left

## 2013-06-14 MED ORDER — MIRTAZAPINE 45 MG PO TABS: 45.0000 mg | ORAL_TABLET | Freq: Every day | ORAL | Status: DC

## 2013-06-14 MED ORDER — TRAZODONE HCL 50 MG PO TABS: ORAL_TABLET | ORAL | Status: DC

## 2013-06-14 MED ORDER — 0.9 % SODIUM CHLORIDE (POUR BTL) OPTIME
TOPICAL | Status: DC | PRN
  Administered 2013-06-14: 1000 mL

## 2013-06-14 MED ORDER — BUPIVACAINE HCL (PF) 0.5 % IJ SOLN
INTRAMUSCULAR | Status: DC | PRN
  Administered 2013-06-14: 20 mL

## 2013-06-14 MED ORDER — LIDOCAINE HCL (CARDIAC) 20 MG/ML IV SOLN
INTRAVENOUS | Status: AC
  Filled 2013-06-14: qty 5

## 2013-06-14 MED ORDER — GLYCOPYRROLATE 0.2 MG/ML IJ SOLN
INTRAMUSCULAR | Status: DC | PRN
Start: 2013-06-14 — End: 2013-06-14
  Administered 2013-06-14: 0.6 mg via INTRAVENOUS

## 2013-06-14 MED ORDER — TRAZODONE 25 MG HALF TABLET: 75.0000 mg | ORAL_TABLET | Freq: Every day | ORAL | Status: DC

## 2013-06-14 MED ORDER — CEFAZOLIN SODIUM-DEXTROSE 2-3 GM-% IV SOLR
2.0000 g | Freq: Once | INTRAVENOUS | Status: AC
  Administered 2013-06-14: 2 g via INTRAVENOUS
  Filled 2013-06-14: qty 50

## 2013-06-14 MED ORDER — LACTATED RINGERS IV SOLN
INTRAVENOUS | Status: DC | PRN
  Administered 2013-06-14: 21:00:00 via INTRAVENOUS

## 2013-06-14 MED ORDER — PROPOFOL 10 MG/ML IV BOLUS
INTRAVENOUS | Status: DC | PRN
  Administered 2013-06-14: 200 mg via INTRAVENOUS

## 2013-06-14 MED ORDER — GLYCOPYRROLATE 0.2 MG/ML IJ SOLN
INTRAMUSCULAR | Status: AC
  Filled 2013-06-14: qty 3

## 2013-06-14 MED ORDER — HYDROMORPHONE HCL PF 1 MG/ML IJ SOLN: 0.2500 mg | INTRAMUSCULAR | Status: DC | PRN

## 2013-06-14 MED ORDER — LIDOCAINE HCL (PF) 2 % IJ SOLN
INTRAMUSCULAR | Status: DC | PRN
  Administered 2013-06-14: 75 mg via INTRADERMAL

## 2013-06-14 MED ORDER — NEOSTIGMINE METHYLSULFATE 1 MG/ML IJ SOLN
INTRAMUSCULAR | Status: DC | PRN
  Administered 2013-06-14: 4 mg via INTRAVENOUS

## 2013-06-14 MED ORDER — MIDAZOLAM HCL 5 MG/5ML IJ SOLN
INTRAMUSCULAR | Status: DC | PRN
  Administered 2013-06-14: 2 mg via INTRAVENOUS

## 2013-06-14 MED ORDER — LACTATED RINGERS IV SOLN: INTRAVENOUS | Status: DC

## 2013-06-14 MED ORDER — FENTANYL CITRATE 0.05 MG/ML IJ SOLN
INTRAMUSCULAR | Status: DC | PRN
  Administered 2013-06-14: 100 ug via INTRAVENOUS
  Administered 2013-06-14: 50 ug via INTRAVENOUS
  Administered 2013-06-14: 100 ug via INTRAVENOUS

## 2013-06-14 MED ORDER — ONDANSETRON HCL 4 MG/2ML IJ SOLN
INTRAMUSCULAR | Status: DC | PRN
  Administered 2013-06-14: 4 mg via INTRAVENOUS

## 2013-06-14 MED ORDER — BUPIVACAINE HCL (PF) 0.5 % IJ SOLN
INTRAMUSCULAR | Status: AC
  Filled 2013-06-14: qty 30

## 2013-06-14 MED ORDER — MIDAZOLAM HCL 2 MG/2ML IJ SOLN
INTRAMUSCULAR | Status: AC
  Filled 2013-06-14: qty 2

## 2013-06-14 MED ORDER — ROCURONIUM BROMIDE 100 MG/10ML IV SOLN
INTRAVENOUS | Status: DC | PRN
  Administered 2013-06-14: 30 mg via INTRAVENOUS

## 2013-06-14 MED ORDER — PANTOPRAZOLE SODIUM 40 MG PO TBEC: 40.0000 mg | DELAYED_RELEASE_TABLET | Freq: Two times a day (BID) | ORAL | Status: DC

## 2013-06-14 MED ORDER — ONDANSETRON HCL 4 MG/2ML IJ SOLN
INTRAMUSCULAR | Status: AC
  Filled 2013-06-14: qty 4

## 2013-06-14 MED ORDER — SODIUM CHLORIDE 0.9 % IV SOLN: INTRAVENOUS | Status: DC

## 2013-06-14 MED ORDER — SODIUM CHLORIDE 0.9 % IV BOLUS (SEPSIS)
1000.0000 mL | Freq: Once | INTRAVENOUS | Status: AC
  Administered 2013-06-14: 1000 mL via INTRAVENOUS

## 2013-06-14 MED ORDER — MORPHINE SULFATE 4 MG/ML IJ SOLN
4.0000 mg | Freq: Once | INTRAMUSCULAR | Status: AC
  Administered 2013-06-14: 4 mg via INTRAVENOUS
  Filled 2013-06-14: qty 1

## 2013-06-14 MED ORDER — ONDANSETRON 4 MG PO TBDP
4.0000 mg | ORAL_TABLET | Freq: Once | ORAL | Status: AC
Start: 2013-06-14 — End: 2013-06-14
  Administered 2013-06-14: 4 mg via ORAL
  Filled 2013-06-14: qty 1

## 2013-06-14 MED ORDER — KETOROLAC TROMETHAMINE 15 MG/ML IJ SOLN: 15.0000 mg | Freq: Four times a day (QID) | INTRAMUSCULAR | Status: DC | PRN

## 2013-06-14 MED ORDER — PROPOFOL 10 MG/ML IV BOLUS
INTRAVENOUS | Status: AC
  Filled 2013-06-14: qty 20

## 2013-06-14 MED ORDER — SUCCINYLCHOLINE CHLORIDE 20 MG/ML IJ SOLN
INTRAMUSCULAR | Status: DC | PRN
  Administered 2013-06-14: 100 mg via INTRAVENOUS

## 2013-06-14 MED ORDER — NEOSTIGMINE METHYLSULFATE 1 MG/ML IJ SOLN
INTRAMUSCULAR | Status: AC
  Filled 2013-06-14: qty 10

## 2013-06-14 MED ORDER — FENTANYL CITRATE 0.05 MG/ML IJ SOLN
INTRAMUSCULAR | Status: AC
  Filled 2013-06-14: qty 5

## 2013-06-14 SURGICAL SUPPLY — 44 items
BENZOIN TINCTURE PRP APPL 2/3 (GAUZE/BANDAGES/DRESSINGS) ×3 IMPLANT
BLADE HEX COATED 2.75 (ELECTRODE) ×3 IMPLANT
BLADE SURG 15 STRL LF DISP TIS (BLADE) ×1 IMPLANT
BLADE SURG 15 STRL SS (BLADE) ×2
BLADE SURG SZ10 CARB STEEL (BLADE) ×3 IMPLANT
CANISTER SUCTION 2500CC (MISCELLANEOUS) IMPLANT
CHLORAPREP W/TINT 26ML (MISCELLANEOUS) ×3 IMPLANT
CLOSURE WOUND 1/2 X4 (GAUZE/BANDAGES/DRESSINGS) ×1
DECANTER SPIKE VIAL GLASS SM (MISCELLANEOUS) IMPLANT
DERMABOND ADVANCED (GAUZE/BANDAGES/DRESSINGS) ×2
DERMABOND ADVANCED .7 DNX12 (GAUZE/BANDAGES/DRESSINGS) ×1 IMPLANT
DRAIN PENROSE 18X1/2 LTX STRL (DRAIN) ×3 IMPLANT
DRAPE LAPAROSCOPIC ABDOMINAL (DRAPES) ×3 IMPLANT
DRSG TEGADERM 4X4.75 (GAUZE/BANDAGES/DRESSINGS) ×3 IMPLANT
ELECT REM PT RETURN 9FT ADLT (ELECTROSURGICAL) ×3
ELECTRODE REM PT RTRN 9FT ADLT (ELECTROSURGICAL) ×1 IMPLANT
GAUZE SPONGE 4X4 16PLY XRAY LF (GAUZE/BANDAGES/DRESSINGS) IMPLANT
GLOVE BIOGEL PI IND STRL 7.0 (GLOVE) ×1 IMPLANT
GLOVE BIOGEL PI INDICATOR 7.0 (GLOVE) ×2
GLOVE SURG SIGNA 7.5 PF LTX (GLOVE) ×3 IMPLANT
GOWN STRL REUS W/TWL LRG LVL3 (GOWN DISPOSABLE) ×3 IMPLANT
GOWN STRL REUS W/TWL XL LVL3 (GOWN DISPOSABLE) ×6 IMPLANT
KIT BASIN OR (CUSTOM PROCEDURE TRAY) ×3 IMPLANT
MESH PARIETEX PROGRIP LEFT (Mesh General) ×3 IMPLANT
NEEDLE HYPO 22GX1.5 SAFETY (NEEDLE) ×3 IMPLANT
NEEDLE HYPO 25X1 1.5 SAFETY (NEEDLE) IMPLANT
NS IRRIG 1000ML POUR BTL (IV SOLUTION) ×3 IMPLANT
PACK BASIC VI WITH GOWN DISP (CUSTOM PROCEDURE TRAY) ×3 IMPLANT
PENCIL BUTTON HOLSTER BLD 10FT (ELECTRODE) ×3 IMPLANT
SPONGE GAUZE 4X4 12PLY (GAUZE/BANDAGES/DRESSINGS) ×3 IMPLANT
SPONGE LAP 4X18 X RAY DECT (DISPOSABLE) ×3 IMPLANT
STRIP CLOSURE SKIN 1/2X4 (GAUZE/BANDAGES/DRESSINGS) ×2 IMPLANT
SUT MNCRL AB 4-0 PS2 18 (SUTURE) ×3 IMPLANT
SUT SILK 2 0 SH (SUTURE) ×3 IMPLANT
SUT VIC AB 2-0 CT1 27 (SUTURE) ×4
SUT VIC AB 2-0 CT1 TAPERPNT 27 (SUTURE) ×2 IMPLANT
SUT VIC AB 3-0 54XBRD REEL (SUTURE) ×1 IMPLANT
SUT VIC AB 3-0 BRD 54 (SUTURE) ×2
SUT VIC AB 3-0 SH 27 (SUTURE) ×4
SUT VIC AB 3-0 SH 27XBRD (SUTURE) ×2 IMPLANT
SYR BULB IRRIGATION 50ML (SYRINGE) IMPLANT
SYR CONTROL 10ML LL (SYRINGE) ×3 IMPLANT
TOWEL OR 17X26 10 PK STRL BLUE (TOWEL DISPOSABLE) ×3 IMPLANT
YANKAUER SUCT BULB TIP 10FT TU (MISCELLANEOUS) ×3 IMPLANT

## 2013-06-14 NOTE — ED Notes (Signed)
[facility], phone number [phone]. Minerva Areolaric would like update once surgery has consulted pt.

## 2013-06-14 NOTE — H&P (Signed)
Bryan Myers is an 43 y.o. male.   Chief Complaint: Incarcerated left inguinal hernia HPI: This is a gentleman who is in in behavioral health has had a known left inguinal hernia that has apparently become increasingly incarcerated. He is a poor historian and it is difficult to tell how long the hernia has actually been out. Again, I am even uncertain how long it has been hurting. He is otherwise without complaints  Past Medical History  Diagnosis Date  . COPD (chronic obstructive pulmonary disease)   . Hernia of abdominal cavity   . Mental disorder   . Depression   . GERD (gastroesophageal reflux disease)   . H/O hiatal hernia   . Headache(784.0)   . Arthritis     Past Surgical History  Procedure Laterality Date  . 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      History reviewed. No pertinent family history. Social History:  reports that he has been smoking Cigarettes.  He has been smoking about 1.00 pack per day. He does not have any smokeless tobacco history on file. He reports that he drinks alcohol. He reports that he does not use illicit drugs.  Allergies: No Known Allergies   (Not in a hospital admission)  Results for orders placed during the hospital encounter of 06/07/13 (from the past 48 hour(s))  LIPASE, BLOOD     Status: None   Collection Time    06/14/13  4:20 PM      Result Value Ref Range   Lipase 41  11 - 59 U/L  COMPREHENSIVE METABOLIC PANEL     Status: Abnormal   Collection Time    06/14/13  4:20 PM      Result Value Ref Range   Sodium 141  137 - 147 mEq/L   Potassium 4.5  3.7 - 5.3 mEq/L   Chloride 101  96 - 112 mEq/L   CO2 29  19 - 32 mEq/L   Glucose, Bld 88  70 - 99 mg/dL   BUN 14  6 - 23 mg/dL   Creatinine, Ser 0.90  0.50 - 1.35 mg/dL   Calcium 9.8  8.4 - 10.5 mg/dL   Total Protein 7.1  6.0 - 8.3 g/dL   Albumin 3.6  3.5 - 5.2 g/dL   AST 44 (*) 0 - 37 U/L   ALT 49  0 - 53 U/L   Alkaline Phosphatase 117  39 -  117 U/L   Total Bilirubin <0.2 (*) 0.3 - 1.2 mg/dL   GFR calc non Af Amer >90  >90 mL/min   GFR calc Af Amer >90  >90 mL/min   Comment: (NOTE)     The eGFR has been calculated using the CKD EPI equation.     This calculation has not been validated in all clinical situations.     eGFR's persistently <90 mL/min signify possible Chronic Kidney     Disease.  CBC WITH DIFFERENTIAL     Status: Abnormal   Collection Time    06/14/13  4:20 PM      Result Value Ref Range   WBC 6.8  4.0 - 10.5 K/uL   RBC 4.28  4.22 - 5.81 MIL/uL   Hemoglobin 13.4  13.0 - 17.0 g/dL   HCT 40.5  39.0 - 52.0 %   MCV 94.6  78.0 - 100.0 fL   MCH 31.3  26.0 - 34.0 pg   MCHC 33.1  30.0 - 36.0 g/dL  RDW 15.0  11.5 - 15.5 %   Platelets 241  150 - 400 K/uL   Neutrophils Relative % 56  43 - 77 %   Neutro Abs 3.8  1.7 - 7.7 K/uL   Lymphocytes Relative 27  12 - 46 %   Lymphs Abs 1.8  0.7 - 4.0 K/uL   Monocytes Relative 14 (*) 3 - 12 %   Monocytes Absolute 0.9  0.1 - 1.0 K/uL   Eosinophils Relative 3  0 - 5 %   Eosinophils Absolute 0.2  0.0 - 0.7 K/uL   Basophils Relative 0  0 - 1 %   Basophils Absolute 0.0  0.0 - 0.1 K/uL  URINALYSIS, ROUTINE W REFLEX MICROSCOPIC     Status: None   Collection Time    06/14/13  4:26 PM      Result Value Ref Range   Color, Urine YELLOW  YELLOW   APPearance CLEAR  CLEAR   Specific Gravity, Urine 1.005  1.005 - 1.030   pH 7.0  5.0 - 8.0   Glucose, UA NEGATIVE  NEGATIVE mg/dL   Hgb urine dipstick NEGATIVE  NEGATIVE   Bilirubin Urine NEGATIVE  NEGATIVE   Ketones, ur NEGATIVE  NEGATIVE mg/dL   Protein, ur NEGATIVE  NEGATIVE mg/dL   Urobilinogen, UA 0.2  0.0 - 1.0 mg/dL   Nitrite NEGATIVE  NEGATIVE   Leukocytes, UA NEGATIVE  NEGATIVE   Comment: MICROSCOPIC NOT DONE ON URINES WITH NEGATIVE PROTEIN, BLOOD, LEUKOCYTES, NITRITE, OR GLUCOSE <1000 mg/dL.   US Scrotum  06/14/2013   CLINICAL DATA:  Swelling and pain.  No left known inguinal hernia  EXAM: ULTRASOUND OF SCROTUM  TECHNIQUE:  Complete ultrasound examination of the testicles, epididymis, and other scrotal structures was performed.  COMPARISON:  None.  FINDINGS: Right testicle  Measurements: 5.5 x 2.6 x 2.7 cm. No mass or microlithiasis visualized.  Left testicle  Measurements: 4.7 x 3.0 x 3.6 cm. The left testicle is displaced inferiorly a left inguinal hernia. Short loop of bowel is noted to extend into the left hemiscrotum .  Right epididymis:  Normal in size and appearance.  Left epididymis:  Normal in size and appearance.  Hydrocele:  Small bilateral hydroceles trauma (right.  Varicocele:  None visualized.  IMPRESSION: 1. Left inguinal hernia with loop of bowel mildly extending into the left hemiscrotum. 2. Normal testicles with normal color Doppler flow and spectral waveforms.   Electronically Signed   By: Suzy Bouchard M.D.   On: 06/14/2013 19:28   [place] Art/ven Flow Abd Pelv Doppler  06/14/2013   CLINICAL DATA:  Swelling and pain.  No left known inguinal hernia  EXAM: ULTRASOUND OF SCROTUM  TECHNIQUE: Complete ultrasound examination of the testicles, epididymis, and other scrotal structures was performed.  COMPARISON:  None.  FINDINGS: Right testicle  Measurements: 5.5 x 2.6 x 2.7 cm. No mass or microlithiasis visualized.  Left testicle  Measurements: 4.7 x 3.0 x 3.6 cm. The left testicle is displaced inferiorly a left inguinal hernia. Short loop of bowel is noted to extend into the left hemiscrotum .  Right epididymis:  Normal in size and appearance.  Left epididymis:  Normal in size and appearance.  Hydrocele:  Small bilateral hydroceles trauma (right.  Varicocele:  None visualized.  IMPRESSION: 1. Left inguinal hernia with loop of bowel mildly extending into the left hemiscrotum. 2. Normal testicles with normal color Doppler flow and spectral waveforms.   Electronically Signed   By: Helane Gunther.D.  On: 06/14/2013 19:28    Review of Systems  Unable to perform ROS: acuity of condition    Blood pressure 106/67,  pulse 74, temperature 98.5 F (36.9 C), temperature source Oral, resp. rate 18, height _0  (1.778 m), weight 130 lb (58.968 kg), SpO2 97.00%. Physical Exam  Constitutional: He is oriented to person, place, and time.  Very thin gentleman in obvious discomfort  HENT:  Head: Normocephalic and atraumatic.  Right Ear: External ear normal.  Left Ear: External ear normal.  Nose: Nose normal.  Mouth/Throat: Oropharynx is clear and moist. No oropharyngeal exudate.  Eyes: Conjunctivae are normal. Pupils are equal, round, and reactive to light. Right eye exhibits no discharge. Left eye exhibits no discharge. No scleral icterus.  Neck: Normal range of motion. Neck supple. No tracheal deviation present.  Cardiovascular: Normal rate, regular rhythm and normal heart sounds.   No murmur heard. Respiratory: Effort normal and breath sounds normal. No respiratory distress. He has no wheezes.  GI: Soft. He exhibits no distension. There is tenderness. There is guarding.  Multiple well-healed incisions on his abdomen. There is an incarcerated, tender left inguinal hernia without evidence of right inguinal hernia  Musculoskeletal: Normal range of motion. He exhibits no edema and no tenderness.  Neurological: He is alert and oriented to person, place, and time.  Skin: Skin is dry. No rash noted. No erythema.     Assessment/Plan Incarcerated left inguinal hernia  Unfortunately, he needs emergent repair with possible mesh. The bowel may be strangulated. I discussed the risk of surgery with him. He understands and wishes to proceed. Surgery is scheduled emergently.  Jewelianna Pancoast A 06/14/2013, 8:12 PM

## 2013-06-14 NOTE — Progress Notes (Signed)
[facility] MD Progress Note  06/14/2013 5:21 PM Bryan Myers  MRN:  [id number] Subjective:  Bryan Myers is anticipating being able to go to rehab in the morning. He has continued to express some improvement in sleep as well as appetite and mood. He feels ready to take the next step. After [facility] he would like to get in a program that would help him get his licence back. Note: late in the afternoon C/O pain in the area of his inguinal  hernia. Endorsed sever pain Diagnosis:   DSM5: Schizophrenia Disorders:  none Obsessive-Compulsive Disorders:  none Trauma-Stressor Disorders:  PTSD Substance/Addictive Disorders:  Alcohol Related Disorder - Severe (303.90) Depressive Disorders:  Major Depressive Disorder - Moderate (296.22) Total Time spent with patient: 30 minutes  Axis I: Substance Induced Mood Disorder  ADL's:  Intact  Sleep: Fair  Appetite:  Fair  Suicidal Ideation:  Plan:  denies Intent:  denies Means:  denies Homicidal Ideation:  Plan:  denies Intent:  denies Means:  denies AEB (as evidenced by):  Psychiatric Specialty Exam: Physical Exam  Review of Systems  Constitutional: Negative.   HENT: Negative.   Eyes: Negative.   Respiratory: Negative.   Cardiovascular: Negative.   Gastrointestinal: Positive for abdominal pain.  Skin: Negative.   Neurological: Negative.   Endo/Heme/Allergies: Negative.   Psychiatric/Behavioral: Positive for depression and substance abuse. The patient is nervous/anxious.     Blood pressure 106/67, pulse 74, temperature 98.5 F (36.9 C), temperature source Oral, resp. rate 18, height $RemoveBe'5\' 10"'AHerbwYEt$  (1.778 m), weight 58.968 kg (130 lb), SpO2 97.00%.Body mass index is 18.65 kg/(m^2).  General Appearance: Fairly Groomed  Engineer, water::  Fair  Speech:  Clear and Coherent  Volume:  Decreased  Mood:  anxious, worried  Affect:  Restricted  Thought Process:  Coherent and Goal Directed  Orientation:  Full (Time, Place, and Person)  Thought Content:  symptoms,  worries, concerns  Suicidal Thoughts:  No  Homicidal Thoughts:  No  Memory:  Immediate;   Fair Recent;   Fair Remote;   Fair  Judgement:  Fair  Insight:  Present and Shallow  Psychomotor Activity:  Restlessness  Concentration:  Fair  Recall:  [employer] of Knowledge:Fair  Language: NA  Akathisia:  No  Handed:    AIMS (if indicated):     Assets:  Desire for Improvement  Sleep:  Number of Hours: 6.75   Musculoskeletal: Strength & Muscle Tone: within normal limits Gait & Station: normal Patient leans: N/A  Current Medications: Current Facility-Administered Medications  Medication Dose Route Frequency Provider Last Rate Last Dose  . acetaminophen (TYLENOL) tablet 650 mg  650 mg Oral Q6H PRN Laverle Hobby, PA-C   650 mg at 06/13/13 8469  . alum & mag hydroxide-simeth (MAALOX/MYLANTA) 200-200-20 MG/5ML suspension 30 mL  30 mL Oral Q4H PRN Laverle Hobby, PA-C      . feeding supplement (ENSURE COMPLETE) (ENSURE COMPLETE) liquid 237 mL  237 mL Oral BID BM Christie Beckers, RD   237 mL at 06/14/13 1418  . gi cocktail (Maalox,Lidocaine,Donnatal)  30 mL Oral TID PRN Nicholaus Bloom, MD   30 mL at 06/14/13 0850  . magnesium hydroxide (MILK OF MAGNESIA) suspension 30 mL  30 mL Oral Daily PRN Laverle Hobby, PA-C      . mirtazapine (REMERON) tablet 45 mg  45 mg Oral QHS Elmarie Shiley, NP   45 mg at 06/13/13 2136  . multivitamin with minerals tablet 1 tablet  1 tablet Oral Daily  Laverle Hobby, PA-C   1 tablet at 06/14/13 0848  . nicotine (NICODERM CQ - dosed in mg/24 hours) patch 21 mg  21 mg Transdermal Q0600 Nicholaus Bloom, MD   21 mg at 06/14/13 [phone]  . pantoprazole (PROTONIX) EC tablet 40 mg  40 mg Oral BID Nicholaus Bloom, MD   40 mg at 06/14/13 1721  . thiamine (B-1) injection 100 mg  100 mg Intramuscular Once [employer], PA-C      . thiamine (VITAMIN B-1) tablet 100 mg  100 mg Oral Daily Laverle Hobby, PA-C   100 mg at 06/14/13 0848  . traZODone (DESYREL) tablet 75 mg  75 mg Oral  QHS Encarnacion Slates, NP   75 mg at 06/13/13 2136   Current Outpatient Prescriptions  Medication Sig Dispense Refill  . mirtazapine (REMERON) 45 MG tablet Take 1 tablet (45 mg total) by mouth at bedtime. For sleep/depression  30 tablet  0  . pantoprazole (PROTONIX) 40 MG tablet Take 1 tablet (40 mg total) by mouth 2 (two) times daily. For acid reflux  60 tablet  0  . traZODone (DESYREL) 50 MG tablet Take 75 mg Q bedtime: For sleep  45 tablet  0    Lab Results:  Results for orders placed during the hospital encounter of 06/07/13 (from the past 48 hour(s))  LIPASE, BLOOD     Status: None   Collection Time    06/14/13  4:20 PM      Result Value Ref Range   Lipase 41  11 - 59 U/L  COMPREHENSIVE METABOLIC PANEL     Status: Abnormal   Collection Time    06/14/13  4:20 PM      Result Value Ref Range   Sodium 141  137 - 147 mEq/L   Potassium 4.5  3.7 - 5.3 mEq/L   Chloride 101  96 - 112 mEq/L   CO2 29  19 - 32 mEq/L   Glucose, Bld 88  70 - 99 mg/dL   BUN 14  6 - 23 mg/dL   Creatinine, Ser 0.90  0.50 - 1.35 mg/dL   Calcium 9.8  8.4 - 10.5 mg/dL   Total Protein 7.1  6.0 - 8.3 g/dL   Albumin 3.6  3.5 - 5.2 g/dL   AST 44 (*) 0 - 37 U/L   ALT 49  0 - 53 U/L   Alkaline Phosphatase 117  39 - 117 U/L   Total Bilirubin <0.2 (*) 0.3 - 1.2 mg/dL   GFR calc non Af Amer >90  >90 mL/min   GFR calc Af Amer >90  >90 mL/min   Comment: (NOTE)     The eGFR has been calculated using the CKD EPI equation.     This calculation has not been validated in all clinical situations.     eGFR's persistently <90 mL/min signify possible Chronic Kidney     Disease.  CBC WITH DIFFERENTIAL     Status: Abnormal   Collection Time    06/14/13  4:20 PM      Result Value Ref Range   WBC 6.8  4.0 - 10.5 K/uL   RBC 4.28  4.22 - 5.81 MIL/uL   Hemoglobin 13.4  13.0 - 17.0 g/dL   HCT 40.5  39.0 - 52.0 %   MCV 94.6  78.0 - 100.0 fL   MCH 31.3  26.0 - 34.0 pg   MCHC 33.1  30.0 - 36.0 g/dL   RDW 15.0  11.5 - 15.5 %    Platelets 241  150 - 400 K/uL   Neutrophils Relative % 56  43 - 77 %   Neutro Abs 3.8  1.7 - 7.7 K/uL   Lymphocytes Relative 27  12 - 46 %   Lymphs Abs 1.8  0.7 - 4.0 K/uL   Monocytes Relative 14 (*) 3 - 12 %   Monocytes Absolute 0.9  0.1 - 1.0 K/uL   Eosinophils Relative 3  0 - 5 %   Eosinophils Absolute 0.2  0.0 - 0.7 K/uL   Basophils Relative 0  0 - 1 %   Basophils Absolute 0.0  0.0 - 0.1 K/uL  URINALYSIS, ROUTINE W REFLEX MICROSCOPIC     Status: None   Collection Time    06/14/13  4:26 PM      Result Value Ref Range   Color, Urine YELLOW  YELLOW   APPearance CLEAR  CLEAR   Specific Gravity, Urine 1.005  1.005 - 1.030   pH 7.0  5.0 - 8.0   Glucose, UA NEGATIVE  NEGATIVE mg/dL   Hgb urine dipstick NEGATIVE  NEGATIVE   Bilirubin Urine NEGATIVE  NEGATIVE   Ketones, ur NEGATIVE  NEGATIVE mg/dL   Protein, ur NEGATIVE  NEGATIVE mg/dL   Urobilinogen, UA 0.2  0.0 - 1.0 mg/dL   Nitrite NEGATIVE  NEGATIVE   Leukocytes, UA NEGATIVE  NEGATIVE   Comment: MICROSCOPIC NOT DONE ON URINES WITH NEGATIVE PROTEIN, BLOOD, LEUKOCYTES, NITRITE, OR GLUCOSE <1000 mg/dL.    Physical Findings: AIMS: Facial and Oral Movements Muscles of Facial Expression: None, normal Lips and Perioral Area: None, normal Jaw: None, normal Tongue: None, normal,Extremity Movements Upper (arms, wrists, hands, fingers): None, normal Lower (legs, knees, ankles, toes): None, normal, Trunk Movements Neck, shoulders, hips: None, normal, Overall Severity Severity of abnormal movements (highest score from questions above): None, normal Incapacitation due to abnormal movements: None, normal Patient's awareness of abnormal movements (rate only patient's report): No Awareness, Dental Status Current problems with teeth and/or dentures?: No Does patient usually wear dentures?: No  CIWA:  CIWA-Ar Total: 0 COWS:  COWS Total Score: 3  Treatment Plan Summary: Daily contact with patient to assess and evaluate symptoms and  progress in treatment Medication management  Plan: Supportive approach/coping skills/relapse prevention           Will refer to the Ed to evaluate the status of his inguinal hernia  Medical Decision Making Problem Points:  New problem, with additional work-up planned (4) and Review of psycho-social stressors (1) Data Points:  Review of medication regiment & side effects (2)  I certify that inpatient services furnished can reasonably be expected to improve the patient's condition.   Rory Montel A 06/14/2013, 5:21 PM

## 2013-06-14 NOTE — Anesthesia Preprocedure Evaluation (Addendum)
Anesthesia Evaluation  Patient identified by MRN, date of birth, ID band Patient awake    Reviewed: Allergy & Precautions, H&P , NPO status , Patient's Chart, lab work & pertinent test results  Airway Mallampati: II TM Distance: >3 FB Neck ROM: full    Dental  (+) Edentulous Upper, Edentulous Lower   Pulmonary COPDCurrent Smoker,  breath sounds clear to auscultation  Pulmonary exam normal       Cardiovascular Exercise Tolerance: Good negative cardio ROS  Rhythm:regular Rate:Normal     Neuro/Psych Depression negative neurological ROS  negative psych ROS   GI/Hepatic negative GI ROS, Neg liver ROS, (+)     substance abuse  alcohol use,   Endo/Other  negative endocrine ROS  Renal/GU negative Renal ROS  negative genitourinary   Musculoskeletal   Abdominal   Peds  Hematology negative hematology ROS (+)   Anesthesia Other Findings   Reproductive/Obstetrics negative OB ROS                          Anesthesia Physical Anesthesia Plan  ASA: III  Anesthesia Plan: General   Post-op Pain Management:    Induction: Intravenous  Airway Management Planned: Oral ETT  Additional Equipment:   Intra-op Plan:   Post-operative Plan: Extubation in OR  Informed Consent: I have reviewed the patients History and Physical, chart, labs and discussed the procedure including the risks, benefits and alternatives for the proposed anesthesia with the patient or authorized representative who has indicated his/her understanding and acceptance.   Dental Advisory Given  Plan Discussed with: CRNA  Anesthesia Plan Comments:         Anesthesia Quick Evaluation

## 2013-06-14 NOTE — ED Provider Notes (Signed)
CSN: [id number]     Arrival date & time 06/14/13  1607 History   First MD Initiated Contact with Patient 06/14/13 1630     Chief Complaint  Patient presents with  . Abdominal Pain     (Consider location/radiation/quality/duration/timing/severity/associated sxs/prior Treatment) HPI Comments: Bryan Myers is a 43 year-old male with a past medical history of Alcohol dependence, multiple abdominal surgeries, presenting the Emergency Department with a chief complaint of worsening lower abdominal pain for 1.5 days.  The patient reports suprapubic and LLQ pain.  He reports hard stools of the past several days, last BM was yesterday morning. The patient states he has flatulence today.  He also reports left sided scrotal swelling for over 1 year, with an increase in swelling and pain for the past several days.  He denies previous hernia repair or follow up with a surgeon in the past. He denies aggravating factors.  The patient reports associated nausea with one episode of non-bloody emesis.  [facility]    Patient is a 43 y.o. male presenting with abdominal pain. The history is provided by the patient and medical records. No language interpreter was used.  Abdominal Pain Associated symptoms: constipation, dysuria, nausea and vomiting   Associated symptoms: no chills, no diarrhea, no fever and no hematuria     Past Medical History  Diagnosis Date  . COPD (chronic obstructive pulmonary disease)   . Hernia of abdominal cavity   . Mental disorder   . Depression   . GERD (gastroesophageal reflux disease)   . H/O hiatal hernia   . Headache(784.0)   . Arthritis    Past Surgical History  Procedure Laterality Date  . 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     History reviewed. No pertinent family history. History  Substance Use Topics  . Smoking status: Current Every Day Smoker -- 1.00 packs/day    Types:  Cigarettes  . Smokeless tobacco: Not on file  . Alcohol Use: Yes     Comment: 2-12, 12oz beers, daily     Review of Systems  Constitutional: Negative for fever and chills.  Gastrointestinal: Positive for nausea, vomiting, abdominal pain and constipation. Negative for diarrhea.  Genitourinary: Positive for dysuria and scrotal swelling. Negative for hematuria.      Allergies  Review of patient's allergies indicates no known allergies.  Home Medications   Current Outpatient Rx  Name  Route  Sig  Dispense  Refill  . mirtazapine (REMERON) 45 MG tablet   Oral   Take 1 tablet (45 mg total) by mouth at bedtime. For sleep/depression   30 tablet   0   . pantoprazole (PROTONIX) 40 MG tablet   Oral   Take 1 tablet (40 mg total) by mouth 2 (two) times daily. For acid reflux   60 tablet   0   . traZODone (DESYREL) 50 MG tablet      Take 75 mg Q bedtime: For sleep   45 tablet   0    BP 106/67  Pulse 74  Temp(Src) 98.5 F (36.9 C) (Oral)  Resp 18  Ht 5\' 10"  (1.778 m)  Wt 130 lb (58.968 kg)  BMI 18.65 kg/m2  SpO2 97% Physical Exam  Nursing note and vitals reviewed. Constitutional: He appears well-developed and well-nourished.  HENT:  Head: Normocephalic and atraumatic.  Neck: Neck supple.  Cardiovascular: Normal rate and regular rhythm.   Pulmonary/Chest: Effort normal. No respiratory  distress. He has no decreased breath sounds. He has no wheezes. He has no rhonchi. He has no rales.  Abdominal: Soft. There is tenderness in the suprapubic area and left lower quadrant. There is no rigidity and no guarding. A hernia is present. Hernia confirmed positive in the left inguinal area. Hernia confirmed negative in the right inguinal area.  Multiple well healed scars to the abdomen.  Genitourinary: Penis normal. Left testis shows tenderness. Circumcised.  Large Left inguinal hernia tender to palpation.    ED Course  Procedures (including critical care time) Labs Review Labs  Reviewed  CBC WITH DIFFERENTIAL - Abnormal; Notable for the following:    Monocytes Relative 14 (*)    All other components within normal limits  LIPASE, BLOOD  COMPREHENSIVE METABOLIC PANEL  URINALYSIS, ROUTINE W REFLEX MICROSCOPIC   Imaging Review No results found.  EKG Interpretation   None       MDM   Final diagnoses:  Incarcerated left inguinal hernia    Pt currently a patient at [facility] presents with 1.5 day of lower abdominal discomfort. Possibly constipation.Will perform a GU exam for evidence of hernia. Exam shows incarcerated hernia.  Medication, IV fluids, labs ordered. [place] ordered to rule out strangulation.  Will place the patient in Trendelenburg and apply ice to the hernia. Dr. Juleen China attempted to reduce the hernia without success. US shows blood flow in the intestine and testicle. Discussed patient history and condition with Dr. Magnus Ivan, who agrees to evaluate the patient in the ED. Pt admitted for hernia repair.          Clabe Seal, PA-C 06/15/13 [phone]

## 2013-06-14 NOTE — Transfer of Care (Signed)
Immediate Anesthesia Transfer of Care Note  Patient: Bryan Myers  Procedure(s) Performed: Procedure(s): HERNIA REPAIR INGUINAL INCARCERATED (Left) INSERTION OF MESH (Left)  Patient Location: PACU  Anesthesia Type:General  Level of Consciousness: awake, alert , oriented and patient cooperative  Airway & Oxygen Therapy: Patient Spontanous Breathing and Patient connected to face mask oxygen  Post-op Assessment: Report given to PACU RN, Post -op Vital signs reviewed and stable and Patient moving all extremities X 4  Post vital signs: Reviewed and stable  Complications: No apparent anesthesia complications

## 2013-06-14 NOTE — Progress Notes (Signed)
Patient ID: Bryan Myers, male   DOB: 1971-04-10, 43 y.o.   MRN: [id number] He has been up and to groups interacting with peers and staff. Has requested and received prn GI cocktail once today. Self inventory: depression 4, hopelessness 4 W/D's agitation denies SI thoughts. Pt brighter today. He has just c/o hernia pain. That he had been to doctor about it 6 months ago. Made the statement that if he didn't get taken care of he would cut it himself in a ED department. That was he could get better and return to work. Dr. Dub MikesLugo informed.  [facility]

## 2013-06-14 NOTE — [facility] Group Notes (Signed)
[facility] LCSW Group Therapy  06/14/2013 2:30 PM  Type of Therapy:  Group Therapy  Participation Level:  Active  Participation Quality:  Attentive  Affect:  Appropriate  Cognitive:  Alert and Oriented  Insight:  Engaged  Engagement in Therapy:  Improving  Modes of Intervention:  Confrontation, Discussion, Education, Limit-setting, Problem-solving, Rapport Building, Socialization and Support  Summary of Progress/Problems: CSW discussed the [employer] as well as other resources in the community. Patient expressed interest in their programs and services and received information on their agency. Discussed pt's discharge plan and obstacles/barriers that may present in executing their discharge plans. Pt discussed d/c tomorrow AM and verbalized understanding of instructions involving [facility] identification.CSW encouraged pt to ask questions and warned him about weather issues/problems with getting ID. Pt stated that he would stay at shelter and follow up at [facility] if issues occurred. PT provided with [facility] contact info including other resources.    Smart, Bethel Sirois LCSWA  06/14/2013, 2:30 PM

## 2013-06-14 NOTE — ED Notes (Signed)
Bed: WA10 Expected date:  Expected time:  Means of arrival:  Comments: Hall b 

## 2013-06-14 NOTE — Progress Notes (Signed)
Recreation Therapy Notes  Date: 02.16.2015 Time: 2:45pm Location: [address] Dayroom   Group Topic: Wellness  Goal Area(s) Addresses:  Patient will define components of whole wellness. Patient will verbalize benefit of whole wellness. Patient will identified how neglecting wellness exacerbates depression/anxiety/substance abuse/ etc.  Behavioral Response: Did not attend.   Marykay Lexenise L Amybeth Sieg, LRT/CTRS  Jearl KlinefelterBlanchfield, Admiral Marcucci L 06/14/2013 8:34 AM

## 2013-06-14 NOTE — Progress Notes (Signed)
[facility] Group Notes:  (Nursing/MHT/Case Management/Adjunct)  Date:  06/14/2013  Time:  3:35 PM  Type of Therapy:  Psychoeducational Skills  Participation Level:  Active  Participation Quality:  Appropriate  Affect:  Appropriate  Cognitive:  Alert and Appropriate  Insight:  Appropriate  Engagement in Group:  Engaged  Modes of Intervention:  Discussion  Summary of Progress/Problems: In group today we discussed coping skills and played coping skills pictionary. He left early to talk to his nurse.  Rosilyn MingsMingia, Sayuri Rhames A 06/14/2013, 3:35 PM

## 2013-06-14 NOTE — ED Notes (Signed)
[facility]. LLQ with hx of hernia. Pt has had multiple abdominal surgeries in the past. Was fed draino as a child. SI, hx of cutting his throat, trying to hang himself, and electrocute himself. Homeless.

## 2013-06-14 NOTE — Clinical Social Work Note (Addendum)
CSW met with pt individually. Pt wants to d/c tomorrow by 8am in order to get to [facility] in order to get temporary ID for [facility]-screening and possible admission scheduled for Thursday 06/14/13 (verified by Merry Proud at [facility]). CSW reviewed bus info and gave pt bus passes. Pt verbalized understanding. CSW encouraged pt to review shelter/resource info in case he cannot get into [facility] on Thursday due to weather/ID problems/etc. Pt stated that he is going to spend the night at the [facility] night shelter and verbalized understanding of resources. Pt told to call [facility] tomorrow to scheduled pickup at [place] on [address] for 7:30AM Thursday morning. CSW also left message for Merry Proud at [place] today. Pt will be going to [facility] tomorrow morning by cab with another pt-cab voucher in chart. PA and MD aware of d/c plan and need for SRA and 14 day med supply. (Pt reports that he has no other supports/no form of transportation other than cab/bus).   [place], LCSWA 06/14/2013 2:13 PM

## 2013-06-14 NOTE — Progress Notes (Signed)
[facility] Discharge Plan :  Will you be returning to the same living situation after discharge: No.[facility] admission.  At discharge, do you have transportation home?:Yes,  cab and bus pass given Do you have the ability to pay for your medications:Yes,  mental health  Release of information consent forms completed and submitted to Medical Records by CSW.  Patient to Follow up at: Follow-up Information   [facility]. (Arrive by 8am with temporary Id/letter [facility], meds, and clothing for screening and possible admission. )    Contact information:   [address] [place], [place] [address] Phone: [phone] Fax: [phone]      Follow [facility] with [facility]. (Walk in between 8am-9am Monday through Friday for hospital followup. )    Contact information:   [address] [place], [place] [address] Phone: [phone] Fax: [phone]      Patient denies SI/HI:   Yes,  self report.    Safety Planning and Suicide Prevention discussed:  Yes,  Pt refused to consent to family contact. SPE completed with pt. SPI pamphlet provided to pt and he was encouraged to share information with support network, ask questions, and talk about any concerns relating to SPE.  Smart, Magnus Crescenzo LCSWA  06/14/2013, 2:17 PM

## 2013-06-14 NOTE — Op Note (Signed)
HERNIA REPAIR INGUINAL INCARCERATED, INSERTION OF MESH  Procedure Note  Bonnee QuinDonald Robar 06/07/2013 - 06/14/2013   Pre-op Diagnosis: left incarcerated inguinal hernia     Post-op Diagnosis: same  Procedure(s): HERNIA REPAIR INGUINAL INCARCERATED INSERTION OF MESH  Surgeon(s): Shelly Rubensteinouglas A Kenadi Miltner, MD  Anesthesia: General  Staff:  Circulator: Johny Saxarolyn R Anderson, RN Scrub Person: Chaun Poreeborah A Blackwell, CST  Estimated Blood Loss: Minimal                         Magaline Steinberg A   Date: 06/14/2013  Time: 9:59 PM

## 2013-06-15 ENCOUNTER — Encounter (HOSPITAL_COMMUNITY): Payer: Self-pay | Admitting: *Deleted

## 2013-06-15 DIAGNOSIS — F4310 Post-traumatic stress disorder, unspecified: Secondary | ICD-10-CM

## 2013-06-15 DIAGNOSIS — K4030 Unilateral inguinal hernia, with obstruction, without gangrene, not specified as recurrent: Secondary | ICD-10-CM | POA: Diagnosis present

## 2013-06-15 DIAGNOSIS — F1020 Alcohol dependence, uncomplicated: Secondary | ICD-10-CM

## 2013-06-15 DIAGNOSIS — F329 Major depressive disorder, single episode, unspecified: Secondary | ICD-10-CM

## 2013-06-15 MED ORDER — NICOTINE 21 MG/24HR TD PT24
21.0000 mg | MEDICATED_PATCH | Freq: Every day | TRANSDERMAL | Status: DC
  Administered 2013-06-15 – 2013-06-16 (×2): 21 mg via TRANSDERMAL
  Filled 2013-06-15 (×2): qty 1

## 2013-06-15 MED ORDER — SODIUM CHLORIDE 0.9 % IV SOLN
INTRAVENOUS | Status: DC
  Administered 2013-06-15: 02:00:00 via INTRAVENOUS

## 2013-06-15 MED ORDER — OXYCODONE-ACETAMINOPHEN 5-325 MG PO TABS
1.0000 | ORAL_TABLET | ORAL | Status: DC | PRN
  Administered 2013-06-15 – 2013-06-16 (×4): 1 via ORAL
  Filled 2013-06-15: qty 1
  Filled 2013-06-15: qty 2
  Filled 2013-06-15: qty 1
  Filled 2013-06-15: qty 2

## 2013-06-15 MED ORDER — ACETAMINOPHEN 325 MG PO TABS
650.0000 mg | ORAL_TABLET | ORAL | Status: DC | PRN
  Administered 2013-06-16: 650 mg via ORAL
  Filled 2013-06-15: qty 2

## 2013-06-15 MED ORDER — PNEUMOCOCCAL VAC POLYVALENT 25 MCG/0.5ML IJ INJ
0.5000 mL | INJECTION | INTRAMUSCULAR | Status: DC
  Filled 2013-06-15 (×2): qty 0.5

## 2013-06-15 MED ORDER — PANTOPRAZOLE SODIUM 40 MG PO TBEC
40.0000 mg | DELAYED_RELEASE_TABLET | Freq: Two times a day (BID) | ORAL | Status: DC
  Administered 2013-06-15 – 2013-06-16 (×3): 40 mg via ORAL
  Filled 2013-06-15 (×5): qty 1

## 2013-06-15 MED ORDER — MIRTAZAPINE 45 MG PO TABS
45.0000 mg | ORAL_TABLET | Freq: Every day | ORAL | Status: DC
  Administered 2013-06-15: 45 mg via ORAL
  Filled 2013-06-15 (×2): qty 1

## 2013-06-15 MED ORDER — IBUPROFEN 600 MG PO TABS
600.0000 mg | ORAL_TABLET | Freq: Four times a day (QID) | ORAL | Status: DC | PRN
  Filled 2013-06-15: qty 1

## 2013-06-15 MED ORDER — KETOROLAC TROMETHAMINE 15 MG/ML IJ SOLN
15.0000 mg | Freq: Four times a day (QID) | INTRAMUSCULAR | Status: DC | PRN
  Administered 2013-06-15 (×2): 15 mg via INTRAVENOUS
  Filled 2013-06-15 (×2): qty 1

## 2013-06-15 MED ORDER — OXYCODONE-ACETAMINOPHEN 5-325 MG PO TABS: 1.0000 | ORAL_TABLET | ORAL | Status: DC | PRN

## 2013-06-15 MED ORDER — IBUPROFEN 200 MG PO TABS: ORAL_TABLET | ORAL | Status: DC

## 2013-06-15 MED ORDER — TRAZODONE HCL 50 MG PO TABS
50.0000 mg | ORAL_TABLET | Freq: Every day | ORAL | Status: DC
  Administered 2013-06-15: 50 mg via ORAL
  Filled 2013-06-15 (×2): qty 1

## 2013-06-15 NOTE — Op Note (Signed)
NAMBonnee Quin:  [name], [name]                 ACCOUNT NO.:  [id number]  MEDICAL RECORD NO.:  [id number]  LOCATION:  1422                         [facility]  PHYSICIAN:  Abigail Miyamotoouglas Kimie Pidcock, M.D. DATE OF BIRTH:  04/19/1971  DATE OF PROCEDURE:  06/14/2013 DATE OF DISCHARGE:                              OPERATIVE REPORT   PREOPERATIVE DIAGNOSIS:  Incarcerated left inguinal hernia.  POSTOPERATIVE DIAGNOSIS:  Incarcerated left inguinal hernia.  PROCEDURE:  Repair of incarcerated left inguinal hernia with mesh.  SURGEON:  Abigail Miyamotoouglas Mendel Binsfeld, MD  ANESTHESIA:  General and 0.5% Marcaine.  ESTIMATED BLOOD LOSS:  Minimal.  FINDINGS:  The patient was found to have an incarcerated left inguinal hernia, which was indirect and containing sigmoid colon.  There was no evidence of bowel ischemia.  PROCEDURE IN DETAIL:  The patient was brought to the operating room, identified as Bryan Myers.  He was placed supine on the operating room table and general anesthesia was induced.  The abdomen was then prepped and draped in the usual sterile fashion.  I made a longitudinal incision to the patient's left groin with a scalpel.  I took this down through Scarpa fascia with electrocautery.  The external oblique fascia was identified and opened towards the internal and external rings.  The testicular cord and structures were controlled with a Penrose drain. The patient had a chronically thick-walled hernia sac.  I opened up the sac and found it to contain sigmoid colon.  There was no evidence of colonic ischemia.  I was able to reduce this all to the abdominal cavity.  I then dissected the sac free towards the internal ring.  I tied off the base of the sac with a 2-0 silk suture and then excised the redundant sac with the cautery.  I then brought a piece of Parietex ProGrip mesh onto the field.  I placed it on the inguinal floor and brought around the cord structures.  I then sewed it to the pubic tubercle, the  shelving edge of the inguinal ligament, transversalis fascia with interrupted 2-0 Vicryl sutures.  Good coverage of the inguinal floor and cord structures appeared to be achieved.  I then closed the external oblique fascia over top of this with running 2-0 Vicryl suture.  I anesthetized the fascia and performed an ilioinguinal nerve block on the left side with Marcaine.  I then closed the Scarpa fascia with interrupted 3-0 Vicryl sutures and closed the skin with a running 4-0 Monocryl.  Steri-Strips, gauze, and Tegaderm were then applied.  The patient tolerated the procedure well.  All the counts were correct at the end of the procedure.  The patient was then extubated in the operating room and taken in stable condition to recovery room.     Abigail Miyamotoouglas Bula Cavalieri, M.D.     DB/MEDQ  D:  06/14/2013  T:  06/15/2013  Job:  [id number]

## 2013-06-15 NOTE — Discharge Summary (Signed)
Physician Discharge Summary Note  Patient:  Bryan Myers is an 43 y.o., male MRN:  [id number] DOB:  1970/10/22 Patient phone:  [phone] (home)  Patient address:   [address] [address],  Total Time spent with patient: Greater than 30 minutes  Date of Admission:  06/14/2013  Date of Discharge: 06/14/12  Reason for Admission:  Alcohol detox  Discharge Diagnoses: Active Problems:   * No active hospital problems. *   Psychiatric Specialty Exam: Physical Exam  Constitutional: He is oriented to person, place, and time. He appears well-developed and well-nourished.  HENT:  Head: Normocephalic.  Eyes: Pupils are equal, round, and reactive to light.  Neck: Normal range of motion.  Cardiovascular: Normal rate.   Respiratory: Effort normal.  GI:  Abdominal pain  Musculoskeletal: Normal range of motion.  Neurological: He is alert and oriented to person, place, and time.  Skin: Skin is warm and dry.  Psychiatric: His speech is normal and behavior is normal. Judgment and thought content normal. His mood appears not anxious. His affect is not angry, not blunt, not labile and not inappropriate. Cognition and memory are normal. He does not exhibit a depressed mood.    Review of Systems  Constitutional: Negative.   HENT: Negative.   Eyes: Negative.   Respiratory: Negative.   Cardiovascular: Negative.   Gastrointestinal: Positive for abdominal pain.       Will sent to the ED for evaluation  Genitourinary: Negative.   Musculoskeletal: Negative.   Skin: Negative.   Neurological: Negative.   Endo/Heme/Allergies: Negative.   Psychiatric/Behavioral: Positive for substance abuse (Alcoholism). Negative for depression, suicidal ideas, hallucinations and memory loss. The patient is not nervous/anxious and does not have insomnia.     Blood pressure 103/58, pulse 95, temperature 99.2 F (37.3 C), temperature source Oral, resp. rate 16, height $RemoveBe'5\' 9"'oRgcRtVur$  (1.753 m), weight 63.322 kg  (139 lb 9.6 oz), SpO2 98.00%.Body mass index is 20.61 kg/(m^2).  General Appearance: Casual and Fairly Groomed  Engineer, water::  Good  Speech:  Clear and Coherent  Volume:  Normal  Mood:  Stable  Affect:  Appropriate and Congruent  Thought Process:  Coherent  Orientation:  Full (Time, Place, and Person)  Thought Content:  Denies any hallucinations, delusional thoughts and or paranoia  Suicidal Thoughts:  No  Homicidal Thoughts:  No  Memory:  Immediate;   Good Recent;   Good Remote;   Good  Judgement:  Good  Insight:  Present  Psychomotor Activity:  Normal  Concentration:  Good  Recall:  Good  Fund of Knowledge:Good  Language: Good  Akathisia:  No  Handed:  Right  AIMS (if indicated):     Assets:  Desire for Improvement  Sleep:       Past Psychiatric History: Diagnosis: Alcohol dependence, Substance induced mood disorder, PTSD  Hospitalizations: [facility]  Outpatient Care: [facility]  Substance Abuse Care: [facility]  Self-Mutilation: Yes, hx. Slashed throat  Suicidal Attempts: "Yes", hx  Violent Behaviors: None reported   Musculoskeletal: Strength & Muscle Tone: within normal limits Gait & Station: normal Patient leans: N/A  DSM5: Schizophrenia Disorders:  NA Obsessive-Compulsive Disorders:  NA Trauma-Stressor Disorders:  Posttraumatic Stress Disorder (309.81) Substance/Addictive Disorders:  Alcohol Related Disorder - Severe (303.90) Depressive Disorders:  Substance induced mood disorder,  Axis Diagnosis:   AXIS I:  Alcohol Related Disorder - Severe (303.90), PTSD, Substance induced mood disorder, AXIS II:  Deferred AXIS III:   Past Medical History  Diagnosis Date  . COPD (  chronic obstructive pulmonary disease)   . Hernia of abdominal cavity   . Mental disorder   . Depression   . GERD (gastroesophageal reflux disease)   . H/O hiatal hernia   . Headache(784.0)   . Arthritis    AXIS IV:  economic problems, housing problems, occupational problems, problems  with primary support group and Alcoholism, chronic AXIS V:  62  Level of Care:  RTC, OP  Hospital Course:  43 Y/O male who states he was admitted "suicidal" . He was planning to hang himself. He has been drinking beer. Drinks a 12 pack aday. Has been drinking like this for couple of years. In 2008 he cut his throat. . States he was hospitalized in a hospsital in [place]. Afterwards he went to [place] where he grew up. From [place] came here. As a baby he was given [facility] by his brother who was couple of years older. . Was in the hospital for over a year. States he suffers from GERD real bad. He is homeless. Admits he is hopeless, helpless wants to die.  Colburn was admitted to the ED complaining of worsening depression, suicidal ideation and intoxicated with blood alcohol level of 304 which dropped down to 112 prior to his admission to [facility]. He was needing detoxification treatment as well as mood stabilization. He was then started on the [facility] detox treatment protocols for alcohol detoxification and related withdrawal symptoms. And for mood stabilization, Lucille was ordered and received Mirtazapine 45 mg Q bedtime for depression and Trazodone 50 mg Q bedtime for sleep. He also received medication management and monitoring for the other medical complaints that he presented. Manny was enrolled in group counseling sessions as well as AA/NA meetings being offered and held on this unit. He learned coping skills that should help him cope better after discharge.  Emerick has completed detox treatment and his mood stabilized. This is evidenced by his reports of improved mood, absence of withdrawal symptoms of alcohol and absence of suicidal ideations. He is currently being discharged to follow-up care at the [facility] clinic for medication management and [place] for substance abuse treatment. He was provided with pertinent information needed to make these appointments. [place] provided [place] with 14 days worth  supply samples of his [facility] discharge medications.  Upon discharge, Greogry adamantly denies any SIHI, AVH, delusions, paranoia and or withdrawal symptoms. However, he did complain of excruciating pain to his left inguinal areas associated with a pre-existing inguinal hernia. He is currently being discharged to the ED for evaluation/treatment of this newly developed  left inguinal pain. He is now discharged to the [facility] ED. After ED evaluation/treatment Deigo is to go home to start substance abuse treatment on 06/16/12.  Consults:  psychiatry  Significant Diagnostic Studies:  labs: Reviewed current ED lab reports, no changes  Discharge Vitals:   Blood pressure 103/58, pulse 95, temperature 99.2 F (37.3 C), temperature source Oral, resp. rate 16, height $RemoveBe'5\' 9"'OEedWBIyz$  (1.753 m), weight 63.322 kg (139 lb 9.6 oz), SpO2 98.00%. Body mass index is 20.61 kg/(m^2). Lab Results:   Results for orders placed during the hospital encounter of 06/07/13 (from the past 72 hour(s))  LIPASE, BLOOD     Status: None   Collection Time    06/14/13  4:20 PM      Result Value Ref Range   Lipase 41  11 - 59 U/L  COMPREHENSIVE METABOLIC PANEL     Status: Abnormal   Collection Time    06/14/13  4:20 PM      Result Value Ref Range   Sodium 141  137 - 147 mEq/L   Potassium 4.5  3.7 - 5.3 mEq/L   Chloride 101  96 - 112 mEq/L   CO2 29  19 - 32 mEq/L   Glucose, Bld 88  70 - 99 mg/dL   BUN 14  6 - 23 mg/dL   Creatinine, Ser 0.90  0.50 - 1.35 mg/dL   Calcium 9.8  8.4 - 10.5 mg/dL   Total Protein 7.1  6.0 - 8.3 g/dL   Albumin 3.6  3.5 - 5.2 g/dL   AST 44 (*) 0 - 37 U/L   ALT 49  0 - 53 U/L   Alkaline Phosphatase 117  39 - 117 U/L   Total Bilirubin <0.2 (*) 0.3 - 1.2 mg/dL   GFR calc non Af Amer >90  >90 mL/min   GFR calc Af Amer >90  >90 mL/min   Comment: (NOTE)     The eGFR has been calculated using the CKD EPI equation.     This calculation has not been validated in all clinical situations.     eGFR's  persistently <90 mL/min signify possible Chronic Kidney     Disease.  CBC WITH DIFFERENTIAL     Status: Abnormal   Collection Time    06/14/13  4:20 PM      Result Value Ref Range   WBC 6.8  4.0 - 10.5 K/uL   RBC 4.28  4.22 - 5.81 MIL/uL   Hemoglobin 13.4  13.0 - 17.0 g/dL   HCT 40.5  39.0 - 52.0 %   MCV 94.6  78.0 - 100.0 fL   MCH 31.3  26.0 - 34.0 pg   MCHC 33.1  30.0 - 36.0 g/dL   RDW 15.0  11.5 - 15.5 %   Platelets 241  150 - 400 K/uL   Neutrophils Relative % 56  43 - 77 %   Neutro Abs 3.8  1.7 - 7.7 K/uL   Lymphocytes Relative 27  12 - 46 %   Lymphs Abs 1.8  0.7 - 4.0 K/uL   Monocytes Relative 14 (*) 3 - 12 %   Monocytes Absolute 0.9  0.1 - 1.0 K/uL   Eosinophils Relative 3  0 - 5 %   Eosinophils Absolute 0.2  0.0 - 0.7 K/uL   Basophils Relative 0  0 - 1 %   Basophils Absolute 0.0  0.0 - 0.1 K/uL  URINALYSIS, ROUTINE W REFLEX MICROSCOPIC     Status: None   Collection Time    06/14/13  4:26 PM      Result Value Ref Range   Color, Urine YELLOW  YELLOW   APPearance CLEAR  CLEAR   Specific Gravity, Urine 1.005  1.005 - 1.030   pH 7.0  5.0 - 8.0   Glucose, UA NEGATIVE  NEGATIVE mg/dL   Hgb urine dipstick NEGATIVE  NEGATIVE   Bilirubin Urine NEGATIVE  NEGATIVE   Ketones, ur NEGATIVE  NEGATIVE mg/dL   Protein, ur NEGATIVE  NEGATIVE mg/dL   Urobilinogen, UA 0.2  0.0 - 1.0 mg/dL   Nitrite NEGATIVE  NEGATIVE   Leukocytes, UA NEGATIVE  NEGATIVE   Comment: MICROSCOPIC NOT DONE ON URINES WITH NEGATIVE PROTEIN, BLOOD, LEUKOCYTES, NITRITE, OR GLUCOSE <1000 mg/dL.  SURGICAL PCR SCREEN     Status: None   Collection Time    06/14/13  8:25 PM      Result Value Ref Range  MRSA, PCR NEGATIVE  NEGATIVE   Staphylococcus aureus NEGATIVE  NEGATIVE   Comment:            The Xpert SA Assay (FDA     approved for NASAL specimens     in patients over 22 years of age),     is one component of     a comprehensive surveillance     program.  Test performance has     been validated by  [employer] for patients greater     than or equal to 48 year old.     It is not intended     to diagnose infection nor to     guide or monitor treatment.    Physical Findings: AIMS:  , ,  ,  ,    CIWA:    COWS:     Psychiatric Specialty Exam: See Psychiatric Specialty Exam and Suicide Risk Assessment completed by Attending Physician prior to discharge.  Discharge destination:  Other:  [facility] ED, then to [facility] on 06/16/13  Is patient on multiple antipsychotic therapies at discharge:  No   Has Patient had three or more failed trials of antipsychotic monotherapy by history:  No  Recommended Plan for Multiple Antipsychotic Therapies: NA     Medication List    ASK your doctor about these medications     Indication   mirtazapine 45 MG tablet  Commonly known as:  REMERON  Take 1 tablet (45 mg total) by mouth at bedtime. For sleep/depression   Indication:  Trouble Sleeping, Major Depressive Disorder     pantoprazole 40 MG tablet  Commonly known as:  PROTONIX  Take 1 tablet (40 mg total) by mouth 2 (two) times daily. For acid reflux   Indication:  Gastroesophageal Reflux Disease     traZODone 50 MG tablet  Commonly known as:  DESYREL  Take 75 mg Q bedtime: For sleep   Indication:  Trouble Sleeping       Follow-up Information   Follow up with Follow up with [facility] On 06/16/2013. (Arrive by 8am with temporary Id/letter from [facility], meds, and clothing for screening and possible admission. )  .   Contact information:   [place], [place], [place] [address] Phone: [phone]      Follow up with Follow up with [facility]. (Walk in between 8am-9am Monday through Friday for hospital followup. )  .   Contact information:   [address] [place], [place] [phone]      Follow-up recommendations:  Activity:  As tolerated Diet: As recommended by your primary care doctor. Keep all scheduled follow-up appointments  as recommended. Continue to work your relapse prevention plan Comments:  Take all your medications as prescribed by your mental healthcare provider. Report any adverse effects and or reactions from your medicines to your outpatient provider promptly. Patient is instructed and cautioned to not engage in alcohol and or illegal drug use while on prescription medicines. In the event of worsening symptoms, patient is instructed to call the crisis hotline, 911 and or go to the nearest ED for appropriate evaluation and treatment of symptoms. Follow-up with your primary care provider for your other medical issues, concerns and or health care needs.   Total Discharge Time:  Greater than 30 minutes.  Signed: Encarnacion Slates, PMHNP-BC 06/15/2013, 10:38 AM Agree with assessment and plan Woodroe Chen. Sabra Heck, M.D.

## 2013-06-15 NOTE — Progress Notes (Signed)
UR completed 

## 2013-06-15 NOTE — Anesthesia Postprocedure Evaluation (Signed)
  Anesthesia Post-op Note  Patient: Bryan Myers  Procedure(s) Performed: Procedure(s) (LRB): HERNIA REPAIR INGUINAL INCARCERATED (Left) INSERTION OF MESH (Left)  Patient Location: PACU  Anesthesia Type: General  Level of Consciousness: awake and alert   Airway and Oxygen Therapy: Patient Spontanous Breathing  Post-op Pain: mild  Post-op Assessment: Post-op Vital signs reviewed, Patient's Cardiovascular Status Stable, Respiratory Function Stable, Patent Airway and No signs of Nausea or vomiting  Last Vitals:  Filed Vitals:   06/14/13 2253  BP: 119/91  Pulse: 56  Temp: 36.4 C  Resp: 14    Post-op Vital Signs: stable   Complications: No apparent anesthesia complications

## 2013-06-15 NOTE — Clinical Social Work Note (Signed)
[facility] called stating that they overbooked and do not have a spot for pt. CSW provided [facility] receptionist with pt's contact number, as [facility].  The Sherwin-WilliamsHeather Smart, LCSWA 06/15/2013 12:15 PM
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
## 2013-06-15 NOTE — Progress Notes (Signed)
RN went to patient's room to perform the assessment and check on the patient. The patient was not in the room. The linens were being changed and the patient returned to the room. He was dressed in clothes for outside and had been smoking outside. The patient was reminded that there has to be no smoking in the hospital. The patient acknowledged that he understood this statement.  The MD on call has been paged - to request a Nicotine patch, strongest strength. Awaiting a response at this time.

## 2013-06-15 NOTE — Discharge Instructions (Signed)
[facility] _______[facility]  UMBILICAL OR INGUINAL HERNIA REPAIR: POST OP INSTRUCTIONS  Always review your discharge instruction sheet given to you by the facility where your surgery was performed. IF YOU HAVE DISABILITY OR FAMILY LEAVE FORMS, YOU MUST BRING THEM TO THE OFFICE FOR PROCESSING.   DO NOT GIVE THEM TO YOUR DOCTOR.  1. A  prescription for pain medication may be given to you upon discharge.  Take your pain medication as prescribed, if needed.  If narcotic pain medicine is not needed, then you may take acetaminophen (Tylenol) or ibuprofen (Advil) as needed. 2. Take your usually prescribed medications unless otherwise directed. 3. If you need a refill on your pain medication, please contact your pharmacy.  They will contact our office to request authorization. Prescriptions will not be filled after 5 pm or on week-ends. 4. You should follow a light diet the first 24 hours after arrival home, such as soup and crackers, etc.  Be sure to include lots of fluids daily.  Resume your normal diet the day after surgery. 5. Most patients will experience some swelling and bruising around the umbilicus or in the groin and scrotum.  Ice packs and reclining will help.  Swelling and bruising can take several days to resolve.  6. It is common to experience some constipation if taking pain medication after surgery.  Increasing fluid intake and taking a stool softener (such as Colace) will usually help or prevent this problem from occurring.  A mild laxative (Milk of Magnesia or Miralax) should be taken according to package directions if there are no bowel movements after 48 hours. 7. Unless discharge instructions indicate otherwise, you may remove your bandages 24-48 hours after surgery, and you may shower at that time.  You may have steri-strips (small skin tapes) in place directly over the incision.  These strips should be left on the skin for 7-10 days.  If your surgeon used skin glue on the  incision, you may shower in 24 hours.  The glue will flake off over the next 2-3 weeks.  Any sutures or staples will be removed at the office during your follow-up visit. 8. ACTIVITIES:  You may resume regular (light) daily activities beginning the next day--such as daily self-care, walking, climbing stairs--gradually increasing activities as tolerated.  You may have sexual intercourse when it is comfortable.  Refrain from any heavy lifting or straining until approved by your doctor. a. You may drive when you are no longer taking prescription pain medication, you can comfortably wear a seatbelt, and you can safely maneuver your car and apply brakes. b. RETURN TO WORK:  __________________________________________________________ 9. You should see your doctor in the office for a follow-up appointment approximately 2-3 weeks after your surgery.  Make sure that you call for this appointment within a day or two after you arrive home to insure a convenient appointment time. 10. OTHER INSTRUCTIONS:  __________________________________________________________________________________________________________________________________________________________________________________________  WHEN TO CALL YOUR DOCTOR: 1. Fever over 101.0 2. Inability to urinate 3. Nausea and/or vomiting 4. Extreme swelling or bruising 5. Continued bleeding from incision. 6. Increased pain, redness, or drainage from the incision  The clinic staff is available to answer your questions during regular business hours.  Please don't hesitate to call and ask to speak to one of the nurses for clinical concerns.  If you have a medical emergency, go to the nearest emergency room or call 911.  A surgeon from [facility] is always on call at the hospital     [address], [address], [place], [place]  [address] ?  [address] [address], [place], [place]   [address] [phone] ? [phone] ? FAX [phone] Web site:  [email or web address]  

## 2013-06-15 NOTE — Progress Notes (Signed)
Clinical Social Work  CSW received a call re: DC plans for patient. [facility] who [facility] reports no need for [facility] and states that patient had follow up appointments scheduled. Per chart review, CSW [employer], at [facility] had [facility]. This information was copied to patient's AVS as well. Unit CSW Tresa Endo(Kelly Foley) reviewed outpatient follow up and provided a bus pass for patient.  No further social work needs identified at this time but can be consulted if needed.  [place] Park[place]olly Tazaria Dlugosz, [place] [phone]

## 2013-06-15 NOTE — Progress Notes (Addendum)
Subjective: He feels fine, voiding allot, regular breakfast, sore but over all looks good.    Objective: Vital signs in last 24 hours: Temp:  [97.5 F (36.4 C)-99.2 F (37.3 C)] 99.2 F (37.3 C) (02/18 0531) Pulse Rate:  [56-95] 95 (02/18 0531) Resp:  [13-18] 16 (02/18 0531) BP: (103-128)/(58-99) 103/58 mmHg (02/18 0531) SpO2:  [97 %-100 %] 98 % (02/18 0531) Weight:  [63.322 kg (139 lb 9.6 oz)] 63.322 kg (139 lb 9.6 oz) (02/17 2340) Last BM Date: 06/13/13 1080 PO recorded. TM 99.2 VSS No labs this Am Intake/Output from previous day: 02/17 0701 - 02/18 0700 In: 1443.8 [P.O.:1080; I.V.:363.8] Out: 575 [Urine:575] Intake/Output this shift:    General appearance: alert, cooperative and no distress Resp: clear to auscultation bilaterally GI: sore LLLQ dressing is dry, scrotum looks normal.  Lab Results:   Recent Labs  06/14/13 1620  WBC 6.8  HGB 13.4  HCT 40.5  PLT 241    BMET  Recent Labs  06/14/13 1620  NA 141  K 4.5  CL 101  CO2 29  GLUCOSE 88  BUN 14  CREATININE 0.90  CALCIUM 9.8   PT/INR No results found for this basename: LABPROT, INR,  in the last 72 hours   Recent Labs Lab 06/14/13 1620  AST 44*  ALT 49  ALKPHOS 117  BILITOT <0.2*  PROT 7.1  ALBUMIN 3.6     Lipase     Component Value Date/Time   LIPASE 41 06/14/2013 1620     Studies/Results: US Scrotum  06/14/2013   CLINICAL DATA:  Swelling and pain.  No left known inguinal hernia  EXAM: ULTRASOUND OF SCROTUM  TECHNIQUE: Complete ultrasound examination of the testicles, epididymis, and other scrotal structures was performed.  COMPARISON:  None.  FINDINGS: Right testicle  Measurements: 5.5 x 2.6 x 2.7 cm. No mass or microlithiasis visualized.  Left testicle  Measurements: 4.7 x 3.0 x 3.6 cm. The left testicle is displaced inferiorly a left inguinal hernia. Short loop of bowel is noted to extend into the left hemiscrotum .  Right epididymis:  Normal in size and appearance.  Left  epididymis:  Normal in size and appearance.  Hydrocele:  Small bilateral hydroceles trauma (right.  Varicocele:  None visualized.  IMPRESSION: 1. Left inguinal hernia with loop of bowel mildly extending into the left hemiscrotum. 2. Normal testicles with normal color Doppler flow and spectral waveforms.   Electronically Signed   By: Genevive Bi M.D.   On: 06/14/2013 19:28   [place] Art/ven Flow Abd Pelv Doppler  06/14/2013   CLINICAL DATA:  Swelling and pain.  No left known inguinal hernia  EXAM: ULTRASOUND OF SCROTUM  TECHNIQUE: Complete ultrasound examination of the testicles, epididymis, and other scrotal structures was performed.  COMPARISON:  None.  FINDINGS: Right testicle  Measurements: 5.5 x 2.6 x 2.7 cm. No mass or microlithiasis visualized.  Left testicle  Measurements: 4.7 x 3.0 x 3.6 cm. The left testicle is displaced inferiorly a left inguinal hernia. Short loop of bowel is noted to extend into the left hemiscrotum .  Right epididymis:  Normal in size and appearance.  Left epididymis:  Normal in size and appearance.  Hydrocele:  Small bilateral hydroceles trauma (right.  Varicocele:  None visualized.  IMPRESSION: 1. Left inguinal hernia with loop of bowel mildly extending into the left hemiscrotum. 2. Normal testicles with normal color Doppler flow and spectral waveforms.   Electronically Signed   By: Genevive Bi M.D.   On:  06/14/2013 19:28    Medications:    No current facility-administered medications on file prior to encounter.   Current Outpatient Prescriptions on File Prior to Encounter  Medication Sig Dispense Refill  . mirtazapine (REMERON) 45 MG tablet Take 1 tablet (45 mg total) by mouth at bedtime. For sleep/depression  30 tablet  0  . pantoprazole (PROTONIX) 40 MG tablet Take 1 tablet (40 mg total) by mouth 2 (two) times daily. For acid reflux  60 tablet  0  . 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 COPD/ tobacco 1PPD ETOH GERD/Hiatial hernia Arthritis Mental disorders; planning suicide, with prior attempts. [facility]. 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  I called and ask them to come back and see him.  [facility].    1500 hours:  BH says he is discharged, [facility] nor [facility].  I will keep him here tonight, he was going to an emergency shelter for the night, [facility]  I have ask staff nursing to [facility], and if he is OK we will let him go tomorrow.   LOS: 1 day    JENNINGS,WILLARD 06/15/2013  Agree with above. Somewhat surprised that behavorial health does not take him back, in that he came from there. He's reading a book by Caryl Aspouglas Adams - interesting choice with his history.  Ovidio Kinavid Carolos Fecher, MD, [facility] Pager: [phone] Office phone:  [phone]

## 2013-06-16 NOTE — Progress Notes (Signed)
Spoke with pt concerning PCP. [facility] for March 2 at 1030 AM. [facility]. Encouraged pt to keep appointment. Te[employer] Back.

## 2013-06-16 NOTE — Progress Notes (Signed)
The patient is resting well.

## 2013-06-16 NOTE — Progress Notes (Signed)
Clinical Social Work Department BRIEF PSYCHOSOCIAL ASSESSMENT 06/16/2013  Patient:  Bryan Myers, Bryan Myers     Account Number:  [id number]     Admit date:  06/14/2013  Clinical Social Worker:  Earlie Server  Date/Time:  06/16/2013 10:15 AM  Referred by:  Physician  Date Referred:  06/16/2013 Referred for  Substance Abuse  Homelessness   Other Referral:   Interview type:  Patient Other interview type:    PSYCHOSOCIAL DATA Living Status:  OTHER Admitted from facility:   Level of care:   Primary support name:  None Primary support relationship to patient:   Degree of support available:   Lacking-patient is homeless    CURRENT CONCERNS Current Concerns  Substance Abuse  Post-Acute Placement   Other Concerns:    SOCIAL WORK ASSESSMENT / PLAN CSW received referral due to patient's substance use and homeless concerns. CSW reviewed chart which stated that psych MD evaluated patient and recommends outpatient follow up. CSW met with patient at bedside. CSW introduced myself and explained role.    Patient reports he was admitted to [facility] about a week ago. 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 Patient reports since he did not get paid he did not have anywhere to stay and became homeless. Patient reports no supports and no friends or family to assist. Patient reports that he also started drinking. CSW inquired about patient's MH and SA but patient is guarded and reports he spoke about all of this information at [facility]. Patient reports that [facility] was helpful but he did not want to talk about all of his problems again. Patient refused to complete SBIRT and reports he is ready to DC.    CSW and patient discussed DC plans. Patient had a scheduled assessment this morning at [facility] but missed appointment due to being hospitalized. CSW called [facility] and tried to reschedule but was informed that patient needs to call. Patient has his own cell phone  and agreeable to follow up with [facility]. Patient reports he has already made plans to stay at [facility] until he is accepted to [facility]. CSW provided patient with bus pass and directions on how to get to the [facility] CSW also gave patient contact information for [facility] for follow up.   Assessment/plan status:  Referral to [employer] Other assessment/ plan:   Patient refused to complete SBIRT   Information/referral to community resources:   [employer]  [facility]  Bus pass    PATIENT'S/FAMILY'S RESPONSE TO PLAN OF CARE: Patient guarded during assessment but open when discussing outpatient follow up. Patient feels that [facility] was helpful and that he can "get back on track with my life now." Patient is ready to DC from the hospital and reports he feels confident he can follow up with outpatient resources. Patient not agreeable to talk about SA or MH in detail but thanked CSW for bus pass.       [place], [place] [phone]

## 2013-06-16 NOTE — Progress Notes (Addendum)
Subjective: Eating breakfast and has medicines and clothes from [facility].  Sore, but over all doing well.  Objective: Vital signs in last 24 hours: Temp:  [98.7 F (37.1 C)-99.1 F (37.3 C)] 98.7 F (37.1 C) (02/19 0423) Pulse Rate:  [73-100] 79 (02/19 0423) Resp:  [16] 16 (02/19 0423) BP: (98-118)/(59-83) 98/59 mmHg (02/19 0423) SpO2:  [96 %-97 %] 96 % (02/19 0423) Last BM Date: 06/13/13  Intake/Output from previous day: 02/18 0701 - 02/19 0700 In: 840 [P.O.:840] Out: 1425 [Urine:1425] Intake/Output this shift:    General appearance: alert, cooperative and no distress GI: LIH incision looks good, +BS, sore, but looks fine, no distension.  Lab Results:   Recent Labs  06/14/13 1620  WBC 6.8  HGB 13.4  HCT 40.5  PLT 241    BMET  Recent Labs  06/14/13 1620  NA 141  K 4.5  CL 101  CO2 29  GLUCOSE 88  BUN 14  CREATININE 0.90  CALCIUM 9.8   PT/INR No results found for this basename: LABPROT, INR,  in the last 72 hours   Recent Labs Lab 06/14/13 1620  AST 44*  ALT 49  ALKPHOS 117  BILITOT <0.2*  PROT 7.1  ALBUMIN 3.6     Lipase     Component Value Date/Time   LIPASE 41 06/14/2013 1620     Studies/Results: US Scrotum  06/14/2013   CLINICAL DATA:  Swelling and pain.  No left known inguinal hernia  EXAM: ULTRASOUND OF SCROTUM  TECHNIQUE: Complete ultrasound examination of the testicles, epididymis, and other scrotal structures was performed.  COMPARISON:  None.  FINDINGS: Right testicle  Measurements: 5.5 x 2.6 x 2.7 cm. No mass or microlithiasis visualized.  Left testicle  Measurements: 4.7 x 3.0 x 3.6 cm. The left testicle is displaced inferiorly a left inguinal hernia. Short loop of bowel is noted to extend into the left hemiscrotum .  Right epididymis:  Normal in size and appearance.  Left epididymis:  Normal in size and appearance.  Hydrocele:  Small bilateral hydroceles trauma (right.  Varicocele:  None visualized.  IMPRESSION: 1. Left inguinal hernia  with loop of bowel mildly extending into the left hemiscrotum. 2. Normal testicles with normal color Doppler flow and spectral waveforms.   Electronically Signed   By: Genevive Bi M.D.   On: 06/14/2013 19:28   [place] Art/ven Flow Abd Pelv Doppler  06/14/2013   CLINICAL DATA:  Swelling and pain.  No left known inguinal hernia  EXAM: ULTRASOUND OF SCROTUM  TECHNIQUE: Complete ultrasound examination of the testicles, epididymis, and other scrotal structures was performed.  COMPARISON:  None.  FINDINGS: Right testicle  Measurements: 5.5 x 2.6 x 2.7 cm. No mass or microlithiasis visualized.  Left testicle  Measurements: 4.7 x 3.0 x 3.6 cm. The left testicle is displaced inferiorly a left inguinal hernia. Short loop of bowel is noted to extend into the left hemiscrotum .  Right epididymis:  Normal in size and appearance.  Left epididymis:  Normal in size and appearance.  Hydrocele:  Small bilateral hydroceles trauma (right.  Varicocele:  None visualized.  IMPRESSION: 1. Left inguinal hernia with loop of bowel mildly extending into the left hemiscrotum. 2. Normal testicles with normal color Doppler flow and spectral waveforms.   Electronically Signed   By: Genevive Bi M.D.   On: 06/14/2013 19:28    Medications: . mirtazapine  45 mg Oral QHS  . nicotine  21 mg Transdermal Daily  . pantoprazole  40 mg  Oral BID  . pneumococcal 23 valent vaccine  0.5 mL Intramuscular Tomorrow-1000  . 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  COPD/ tobacco 1PPD  ETOH  GERD/Hiatial hernia  Arthritis  Mental disorders; planning suicide, with prior attempts.  Homeless   [facility].  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   LOS: 2 days    JENNINGS,WILLARD 06/16/2013  Agree with above.  Ovidio Kinavid Ivalee Strauser, MD, [facility] Pager: [phone] Office phone:  [phone]

## 2013-06-16 NOTE — Progress Notes (Signed)
Discharge instructions given and explained to patient and he verbalized understanding, denies any distress. Surgical incision clean/dry and intact with steri strips; no other wound noted. Bus pass given to patient go to a shelter by CSW.

## 2013-06-16 NOTE — Progress Notes (Signed)
Patient discharged to a shelter, bus pass given by CSW. Transported to the bus stop by staff via wheelchair. Patient in no distress, all belongings given to patient.

## 2013-06-17 NOTE — Progress Notes (Signed)
Patient Discharge Instructions:  After Visit Summary (AVS):   Faxed to:  06/17/13 Discharge Summary Note:   Faxed to:  06/17/13 Psychiatric Admission Assessment Note:   Faxed to:  06/17/13 Faxed/Sent to the Next Level Care provider:  06/17/13 [facility] @ [phone] [facility]ax[facility] to [facility] @ [phone]  Jerelene ReddenSheena E [place], 06/17/2013, 3:06 PM

## 2013-06-18 NOTE — ED Provider Notes (Signed)
Medical screening examination/treatment/procedure(s) were performed by non-physician practitioner and as supervising physician I was immediately available for consultation/collaboration.     Raeford RazorStephen Brooklinn Longbottom, MD 06/18/13 [phone]

## 2013-06-27 ENCOUNTER — Ambulatory Visit: Payer: Self-pay | Attending: Internal Medicine | Admitting: Internal Medicine

## 2013-06-27 ENCOUNTER — Encounter: Payer: Self-pay | Admitting: Internal Medicine

## 2013-06-27 VITALS — BP 107/73 | HR 80 | Temp 98.9°F | Resp 14 | Ht 69.0 in | Wt 126.4 lb

## 2013-06-27 DIAGNOSIS — F329 Major depressive disorder, single episode, unspecified: Secondary | ICD-10-CM | POA: Insufficient documentation

## 2013-06-27 DIAGNOSIS — K4090 Unilateral inguinal hernia, without obstruction or gangrene, not specified as recurrent: Secondary | ICD-10-CM

## 2013-06-27 DIAGNOSIS — F17200 Nicotine dependence, unspecified, uncomplicated: Secondary | ICD-10-CM | POA: Insufficient documentation

## 2013-06-27 DIAGNOSIS — F4310 Post-traumatic stress disorder, unspecified: Secondary | ICD-10-CM | POA: Insufficient documentation

## 2013-06-27 DIAGNOSIS — J449 Chronic obstructive pulmonary disease, unspecified: Secondary | ICD-10-CM | POA: Insufficient documentation

## 2013-06-27 DIAGNOSIS — R509 Fever, unspecified: Secondary | ICD-10-CM | POA: Insufficient documentation

## 2013-06-27 DIAGNOSIS — K219 Gastro-esophageal reflux disease without esophagitis: Secondary | ICD-10-CM | POA: Insufficient documentation

## 2013-06-27 DIAGNOSIS — J4489 Other specified chronic obstructive pulmonary disease: Secondary | ICD-10-CM | POA: Insufficient documentation

## 2013-06-27 DIAGNOSIS — F3289 Other specified depressive episodes: Secondary | ICD-10-CM | POA: Insufficient documentation

## 2013-06-27 DIAGNOSIS — R109 Unspecified abdominal pain: Secondary | ICD-10-CM | POA: Insufficient documentation

## 2013-06-27 DIAGNOSIS — Z0000 Encounter for general adult medical examination without abnormal findings: Secondary | ICD-10-CM

## 2013-06-27 LAB — LIPID PANEL
Cholesterol: 150 mg/dL (ref 0–200)
HDL: 33 mg/dL — ABNORMAL LOW (ref >39)
LDL Cholesterol: 98 mg/dL (ref 0–99)
Total CHOL/HDL Ratio: 4.5 Ratio
Triglycerides: 95 mg/dL (ref <150)
VLDL: 19 mg/dL (ref 0–40)

## 2013-06-27 LAB — CBC WITH DIFFERENTIAL/PLATELET
BASOS PCT: 1 % (ref 0–1)
Basophils Absolute: 0.1 10*3/uL (ref 0.0–0.1)
EOS PCT: 3 % (ref 0–5)
Eosinophils Absolute: 0.2 10*3/uL (ref 0.0–0.7)
HEMATOCRIT: 39.2 % (ref 39.0–52.0)
HEMOGLOBIN: 13.1 g/dL (ref 13.0–17.0)
Lymphocytes Relative: 29 % (ref 12–46)
Lymphs Abs: 1.7 10*3/uL (ref 0.7–4.0)
MCH: 30.5 pg (ref 26.0–34.0)
MCHC: 33.4 g/dL (ref 30.0–36.0)
MCV: 91.4 fL (ref 78.0–100.0)
MONO ABS: 0.8 10*3/uL (ref 0.1–1.0)
Monocytes Relative: 13 % — ABNORMAL HIGH (ref 3–12)
NEUTROS ABS: 3.2 10*3/uL (ref 1.7–7.7)
Neutrophils Relative %: 54 % (ref 43–77)
Platelets: 404 10*3/uL — ABNORMAL HIGH (ref 150–400)
RBC: 4.29 MIL/uL (ref 4.22–5.81)
RDW: 15.1 % (ref 11.5–15.5)
WBC: 5.9 10*3/uL (ref 4.0–10.5)

## 2013-06-27 LAB — COMPLETE METABOLIC PANEL WITH GFR
ALK PHOS: 72 U/L (ref 39–117)
ALT: 32 U/L (ref 0–53)
AST: 21 U/L (ref 0–37)
Albumin: 4.1 g/dL (ref 3.5–5.2)
BUN: 7 mg/dL (ref 6–23)
CALCIUM: 9.5 mg/dL (ref 8.4–10.5)
CHLORIDE: 104 meq/L (ref 96–112)
CO2: 27 mEq/L (ref 19–32)
Creat: 0.79 mg/dL (ref 0.50–1.35)
GFR, Est African American: >89 mL/min
GLUCOSE: 76 mg/dL (ref 70–99)
POTASSIUM: 4.1 meq/L (ref 3.5–5.3)
Sodium: 141 mEq/L (ref 135–145)
Total Bilirubin: 0.3 mg/dL (ref 0.2–1.2)
Total Protein: 6.4 g/dL (ref 6.0–8.3)

## 2013-06-27 MED ORDER — TRAMADOL HCL 50 MG PO TABS: 100.0000 mg | ORAL_TABLET | Freq: Four times a day (QID) | ORAL | Status: DC | PRN

## 2013-06-27 NOTE — Progress Notes (Signed)
Patient was referred from the hospital. Patient is here to establish care. Complains of abdominal pain x1 week; pain scale of 8 today. Also complains of a moderate and throbbing headache. Patient refused the flu vaccine.

## 2013-06-27 NOTE — Progress Notes (Signed)
Patient ID: Bryan Myers, male   DOB: Oct 26, 1970, 43 y.o.   MRN: [id number]   CC:  HPI: 43 year old male recently hospitalized for a left incarcerated inguinal hernia requiring emergent repair with possible mesh. The patient is complaining off low-grade fever yesterday, denies any ongoing use of alcohol. Denies any cough, dysuria. He simply complains of localized left inguinal pain. He does not know if he has a followup with Dr. Magnus IvanBlackman who did the surgery.   Currently living at a shelter. [facility] not find his way to the facility. He has a history of COPD, depression, GERD. He has a history of Partial esophugus, stomach, and colon removal due to drinking draino as a kid History of polysubstance abuse alcohol abuse.    Social History: reports that he has been smoking Cigarettes. He has been smoking about 1.00 pack per day. He does not have any smokeless tobacco history on file. He reports that he drinks alcohol. He reports that he does not use illicit drugs.   Family history positive for heart attack and would benefit age of 44 and 7262 respectively  No Known Allergies Past Medical History  Diagnosis Date  . COPD (chronic obstructive pulmonary disease)   . Hernia of abdominal cavity   . Mental disorder   . Depression   . GERD (gastroesophageal reflux disease)   . H/O hiatal hernia   . Headache(784.0)   . Arthritis    Current Outpatient Prescriptions on File Prior to Visit  Medication Sig Dispense Refill  . ibuprofen (ADVIL,MOTRIN) 200 MG tablet You can take 2-3 of these every 6 hours as needed for pain.  You can use this or tylenol every 6 hours.  30 tablet  0  . mirtazapine (REMERON) 45 MG tablet Take 1 tablet (45 mg total) by mouth at bedtime. For sleep/depression  30 tablet  0  . oxyCODONE-acetaminophen (PERCOCET/ROXICET) 5-325 MG per tablet Take 1-2 tablets by mouth every 4 (four) hours as needed for moderate pain.  40 tablet  0  . pantoprazole  (PROTONIX) 40 MG tablet Take 1 tablet (40 mg total) by mouth 2 (two) times daily. For acid reflux  60 tablet  0  . traZODone (DESYREL) 50 MG tablet Take 75 mg Q bedtime: For sleep  45 tablet  0   No current facility-administered medications on file prior to visit.   Family History  Problem Relation Age of Onset  . Heart disease Mother   . Heart disease Father   . Heart disease Sister   . Heart disease Brother    History   Social History  . Marital Status: Single    Spouse Name: N/A    Number of Children: N/A  . Years of Education: N/A   Occupational History  . Not on file.   Social History Main Topics  . Smoking status: Current Every Day Smoker -- 1.00 packs/day    Types: Cigarettes  . Smokeless tobacco: Not on file  . Alcohol Use: Yes     Comment: 2-12, 12oz beers, daily   . Drug Use: No  . Sexual Activity: Yes    Birth Control/ Protection: None   Other Topics Concern  . Not on file   Social History Narrative  . No narrative on file    Review of Systems  Constitutional: Negative for fever, chills, diaphoresis, activity change, appetite change and fatigue.  HENT: Negative for ear pain, nosebleeds, congestion, facial swelling, rhinorrhea, neck pain, neck  stiffness and ear discharge.   Eyes: Negative for pain, discharge, redness, itching and visual disturbance.  Respiratory: Negative for cough, choking, chest tightness, shortness of breath, wheezing and stridor.   Cardiovascular: Negative for chest pain, palpitations and leg swelling.  Gastrointestinal as in history of present illness Genitourinary: Negative for dysuria, urgency, frequency, hematuria, flank pain, decreased urine volume, difficulty urinating and dyspareunia.  Musculoskeletal: Negative for back pain, joint swelling, arthralgias and gait problem.  Neurological: Negative for dizziness, tremors, seizures, syncope, facial asymmetry, speech difficulty, weakness, light-headedness, numbness and headaches.   Hematological: Negative for adenopathy. Does not bruise/bleed easily.  Psychiatric/Behavioral: Negative for hallucinations, behavioral problems, confusion, dysphoric mood, decreased concentration and agitation.    Objective:   Filed Vitals:   06/27/13 0901  BP: 107/73  Pulse: 80  Temp: 98.9 F (37.2 C)  Resp: 14    Physical Exam  Constitutional: Appears well-developed and well-nourished. No distress.  HENT: Normocephalic. External right and left ear normal. Oropharynx is clear and moist.  Eyes: Conjunctivae and EOM are normal. PERRLA, no scleral icterus.  Neck: Normal ROM. Neck supple. No JVD. No tracheal deviation. No thyromegaly.  CVS: RRR, S1/S2 +, no murmurs, no gallops, no carotid bruit.  Pulmonary: Effort and breath sounds normal, no stridor, rhonchi, wheezes, rales.  Abdominal: Soft. BS +,  no distension, tenderness, rebound or guarding.  Musculoskeletal: Normal range of motion. No edema and no tenderness.  Lymphadenopathy: No lymphadenopathy noted, cervical, inguinal. Neuro: Alert. Normal reflexes, muscle tone coordination. No cranial nerve deficit. Skin: Skin is warm and dry. No rash noted. Not diaphoretic. No erythema. No pallor.  Psychiatric: Normal mood and affect. Behavior, judgment, thought content normal.   Lab Results  Component Value Date   WBC 6.8 06/14/2013   HGB 13.4 06/14/2013   HCT 40.5 06/14/2013   MCV 94.6 06/14/2013   PLT 241 06/14/2013   Lab Results  Component Value Date   CREATININE 0.90 06/14/2013   BUN 14 06/14/2013   NA 141 06/14/2013   K 4.5 06/14/2013   CL 101 06/14/2013   CO2 29 06/14/2013    No results found for this basename: HGBA1C   Lipid Panel  No results found for this basename: chol, trig, hdl, cholhdl, vldl, ldlcalc       Assessment and plan:   Patient Active Problem List   Diagnosis Date Noted  . Incarcerated left inguinal hernia 06/15/2013  . Incarcerated inguinal hernia 06/14/2013  . Substance induced mood disorder  06/07/2013  . Alcohol dependence 06/07/2013  . PTSD (post-traumatic stress disorder) 06/07/2013  . Major depression 06/07/2013    incarcerated and lateral hernia Will schedule followup with Dr. Magnus Ivan Surgical site examined with no localized erythema, abscess or drainage Patient does not have any Percocet anymore We'll prescribe Ultram for pain  Fever Patient denies any pulmonary symptoms or urinary symptoms Rule out underlying etiology with a chest x-ray, UA Doubt that there is any intra-abdominal abscess or localized abscess that needs repeat imaging We'll check a CBC   Establish care Patient refusing flu vaccination Currently not interested in any immunization Smoking cessation counseling done Requesting Chantix which I feel is not appropriate given his psychiatric history   Depression/polysubstance abuse Patient will establish with a psychiatrist We'll provide a referral Continue Remeron  Follow up in 3 months  The patient was given clear instructions to go to ER or return to medical center if symptoms don't improve, worsen or new problems develop. The patient verbalized understanding. The patient was told  to call to get any lab results if not heard anything in the next week.

## 2013-06-28 ENCOUNTER — Encounter (HOSPITAL_COMMUNITY): Payer: Self-pay | Admitting: Emergency Medicine

## 2013-06-28 ENCOUNTER — Encounter (INDEPENDENT_AMBULATORY_CARE_PROVIDER_SITE_OTHER): Payer: Self-pay | Admitting: Surgery

## 2013-06-28 ENCOUNTER — Emergency Department (HOSPITAL_COMMUNITY)
Admission: EM | Admit: 2013-06-28 | Discharge: 2013-06-29 | Disposition: A | Discharge status: Home / Self Care | Payer: Self-pay | Attending: Emergency Medicine | Admitting: Emergency Medicine

## 2013-06-28 DIAGNOSIS — F1020 Alcohol dependence, uncomplicated: Secondary | ICD-10-CM | POA: Diagnosis present

## 2013-06-28 DIAGNOSIS — M129 Arthropathy, unspecified: Secondary | ICD-10-CM | POA: Insufficient documentation

## 2013-06-28 DIAGNOSIS — Z9889 Other specified postprocedural states: Secondary | ICD-10-CM | POA: Insufficient documentation

## 2013-06-28 DIAGNOSIS — F17200 Nicotine dependence, unspecified, uncomplicated: Secondary | ICD-10-CM | POA: Insufficient documentation

## 2013-06-28 DIAGNOSIS — Z79899 Other long term (current) drug therapy: Secondary | ICD-10-CM | POA: Insufficient documentation

## 2013-06-28 DIAGNOSIS — F329 Major depressive disorder, single episode, unspecified: Secondary | ICD-10-CM | POA: Insufficient documentation

## 2013-06-28 DIAGNOSIS — F1994 Other psychoactive substance use, unspecified with psychoactive substance-induced mood disorder: Secondary | ICD-10-CM | POA: Diagnosis present

## 2013-06-28 DIAGNOSIS — K219 Gastro-esophageal reflux disease without esophagitis: Secondary | ICD-10-CM | POA: Insufficient documentation

## 2013-06-28 DIAGNOSIS — J449 Chronic obstructive pulmonary disease, unspecified: Secondary | ICD-10-CM | POA: Insufficient documentation

## 2013-06-28 DIAGNOSIS — K4030 Unilateral inguinal hernia, with obstruction, without gangrene, not specified as recurrent: Secondary | ICD-10-CM | POA: Insufficient documentation

## 2013-06-28 DIAGNOSIS — J4489 Other specified chronic obstructive pulmonary disease: Secondary | ICD-10-CM | POA: Insufficient documentation

## 2013-06-28 DIAGNOSIS — F4310 Post-traumatic stress disorder, unspecified: Secondary | ICD-10-CM | POA: Insufficient documentation

## 2013-06-28 LAB — COMPREHENSIVE METABOLIC PANEL
ALT: 35 U/L (ref 0–53)
AST: 27 U/L (ref 0–37)
Albumin: 4 g/dL (ref 3.5–5.2)
Alkaline Phosphatase: 90 U/L (ref 39–117)
BUN: 7 mg/dL (ref 6–23)
CALCIUM: 9.3 mg/dL (ref 8.4–10.5)
CO2: 25 mEq/L (ref 19–32)
Chloride: 103 mEq/L (ref 96–112)
Creatinine, Ser: 0.76 mg/dL (ref 0.50–1.35)
GFR calc non Af Amer: >90 mL/min (ref >90)
Glucose, Bld: 89 mg/dL (ref 70–99)
Potassium: 4.2 mEq/L (ref 3.7–5.3)
SODIUM: 142 meq/L (ref 137–147)
Total Bilirubin: <0.2 mg/dL — ABNORMAL LOW (ref 0.3–1.2)
Total Protein: 7.5 g/dL (ref 6.0–8.3)

## 2013-06-28 LAB — RAPID URINE DRUG SCREEN, HOSP PERFORMED
Amphetamines: NOT DETECTED
BARBITURATES: NOT DETECTED
Benzodiazepines: NOT DETECTED
COCAINE: NOT DETECTED
Opiates: NOT DETECTED
TETRAHYDROCANNABINOL: NOT DETECTED

## 2013-06-28 LAB — CBC
HCT: 42.5 % (ref 39.0–52.0)
Hemoglobin: 14.3 g/dL (ref 13.0–17.0)
MCH: 31 pg (ref 26.0–34.0)
MCHC: 33.6 g/dL (ref 30.0–36.0)
MCV: 92.2 fL (ref 78.0–100.0)
PLATELETS: 395 10*3/uL (ref 150–400)
RBC: 4.61 MIL/uL (ref 4.22–5.81)
RDW: 14.4 % (ref 11.5–15.5)
WBC: 9 10*3/uL (ref 4.0–10.5)

## 2013-06-28 LAB — ACETAMINOPHEN LEVEL: Acetaminophen (Tylenol), Serum: <15 ug/mL (ref 10–30)

## 2013-06-28 LAB — TSH: TSH: 0.749 u[IU]/mL (ref 0.350–4.500)

## 2013-06-28 LAB — ETHANOL: Alcohol, Ethyl (B): 146 mg/dL — ABNORMAL HIGH (ref 0–11)

## 2013-06-28 LAB — SALICYLATE LEVEL: Salicylate Lvl: <2 mg/dL — ABNORMAL LOW (ref 2.8–20.0)

## 2013-06-28 MED ORDER — LORAZEPAM 2 MG/ML IJ SOLN
2.0000 mg | Freq: Four times a day (QID) | INTRAMUSCULAR | Status: DC | PRN
  Administered 2013-06-28: 2 mg via INTRAVENOUS
  Filled 2013-06-28: qty 1

## 2013-06-28 MED ORDER — THIAMINE HCL 100 MG/ML IJ SOLN: 100.0000 mg | Freq: Every day | INTRAMUSCULAR | Status: DC

## 2013-06-28 MED ORDER — VITAMIN B-1 100 MG PO TABS: 100.0000 mg | ORAL_TABLET | Freq: Every day | ORAL | Status: DC

## 2013-06-28 MED ORDER — LORAZEPAM 1 MG PO TABS
0.0000 mg | ORAL_TABLET | Freq: Four times a day (QID) | ORAL | Status: DC
  Filled 2013-06-28: qty 2
  Filled 2013-06-28: qty 1

## 2013-06-28 MED ORDER — HALOPERIDOL LACTATE 5 MG/ML IJ SOLN
5.0000 mg | Freq: Once | INTRAMUSCULAR | Status: AC
  Administered 2013-06-28: 5 mg via INTRAMUSCULAR
  Filled 2013-06-28: qty 1

## 2013-06-28 MED ORDER — GI COCKTAIL ~~LOC~~
30.0000 mL | Freq: Once | ORAL | Status: AC
  Administered 2013-06-28: 30 mL via ORAL
  Filled 2013-06-28: qty 30

## 2013-06-28 MED ORDER — LORAZEPAM 1 MG PO TABS: 0.0000 mg | ORAL_TABLET | Freq: Two times a day (BID) | ORAL | Status: DC

## 2013-06-28 NOTE — ED Notes (Signed)
[id number] Expected date:  Expected time:  Means of arrival:  Comments: Kasel

## 2013-06-28 NOTE — ED Provider Notes (Signed)
CSN: [id number]     Arrival date & time 06/28/13  1749 History   First MD Initiated Contact with Patient 06/28/13 1811     Chief Complaint  Patient presents with  . Medical Clearance     (Consider location/radiation/quality/duration/timing/severity/associated sxs/prior Treatment) HPI Comments: 43 year old male presents with police with IVC papers. According to the papers and the police the patient went to the psychiatrist's office and admitted to having suicidal thoughts. Patient has a history of prior alcohol abuse and suicidal attempts. Due to this her psychiatrist took an IVC papers. The patient currently denies any suicidal thoughts and states he was intoxicated. Originally he states that when he gets intoxicated "the truth comes out"and that this is probably what happened. When I asked if this meant that he was suicidal history thoughts he states no this time he must not have been telling the truth when he was intoxicated.   Past Medical History  Diagnosis Date  . COPD (chronic obstructive pulmonary disease)   . Hernia of abdominal cavity   . Mental disorder   . Depression   . GERD (gastroesophageal reflux disease)   . H/O hiatal hernia   . Headache(784.0)   . Arthritis    Past Surgical History  Procedure Laterality Date  . 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    . Inguinal hernia repair Left 06/14/2013    Procedure: HERNIA REPAIR INGUINAL INCARCERATED;  Surgeon: Shelly Rubenstein, MD;  Location: [facility];  Service: General;  Laterality: Left;  . Insertion of mesh Left 06/14/2013    Procedure: INSERTION OF MESH;  Surgeon: Shelly Rubenstein, MD;  Location: [facility];  Service: General;  Laterality: Left;   Family History  Problem Relation Age of Onset  . Heart disease Mother   . Heart disease Father   . Heart disease Sister   . Heart disease Brother    History  Substance Use Topics  . Smoking status: Current Every Day Smoker -- 1.00  packs/day    Types: Cigarettes  . Smokeless tobacco: Not on file  . Alcohol Use: Yes     Comment: 2-12, 12oz beers, daily     Review of Systems  Respiratory: Negative for cough and shortness of breath.   Psychiatric/Behavioral: Negative for suicidal ideas and self-injury.      Allergies  Review of patient's allergies indicates no known allergies.  Home Medications   Current Outpatient Rx  Name  Route  Sig  Dispense  Refill  . ibuprofen (ADVIL,MOTRIN) 200 MG tablet      You can take 2-3 of these every 6 hours as needed for pain.  You can use this or tylenol every 6 hours.   30 tablet   0   . mirtazapine (REMERON) 45 MG tablet   Oral   Take 1 tablet (45 mg total) by mouth at bedtime. For sleep/depression   30 tablet   0   . oxyCODONE-acetaminophen (PERCOCET/ROXICET) 5-325 MG per tablet   Oral   Take 1-2 tablets by mouth every 4 (four) hours as needed for moderate pain.         . pantoprazole (PROTONIX) 40 MG tablet   Oral   Take 1 tablet (40 mg total) by mouth 2 (two) times daily. For acid reflux   60 tablet   0   . traZODone (DESYREL) 50 MG tablet   Oral   Take 50 mg by mouth at bedtime. Take 75 mg Q bedtime:  For sleep         . traMADol (ULTRAM) 50 MG tablet   Oral   Take 2 tablets (100 mg total) by mouth every 6 (six) hours as needed.   45 tablet   0    BP 110/76  Pulse 85  Temp(Src) 97.5 F (36.4 C) (Oral)  Resp 16  SpO2 96% Physical Exam  Nursing note and vitals reviewed. Constitutional: He is oriented to person, place, and time. He appears well-developed and well-nourished.  HENT:  Head: Normocephalic and atraumatic.  Right Ear: External ear normal.  Left Ear: External ear normal.  Nose: Nose normal.  Eyes: Right eye exhibits no discharge. Left eye exhibits no discharge.  Neck: Neck supple.  Cardiovascular: Normal rate, regular rhythm, normal heart sounds and intact distal pulses.   Pulmonary/Chest: Effort normal and breath sounds normal.  He has no wheezes.  Abdominal: Soft. There is no tenderness.  Musculoskeletal: He exhibits no edema.  Neurological: He is alert and oriented to person, place, and time.  Skin: Skin is warm and dry.  Psychiatric: He is not actively hallucinating. He expresses no homicidal and no suicidal ideation.    ED Course  Procedures (including critical care time) Labs Review Labs Reviewed  COMPREHENSIVE METABOLIC PANEL - Abnormal; Notable for the following:    Total Bilirubin <0.2 (*)    All other components within normal limits  ETHANOL - Abnormal; Notable for the following:    Alcohol, Ethyl (B) 146 (*)    All other components within normal limits  SALICYLATE LEVEL - Abnormal; Notable for the following:    Salicylate Lvl <2.0 (*)    All other components within normal limits  CBC  URINE RAPID DRUG SCREEN (HOSP PERFORMED)  ACETAMINOPHEN LEVEL   Imaging Review No results found.   EKG Interpretation None      MDM   Final diagnoses:  None    The patient's story seems to change. A psychiatrist IVC'd him, I will have psych see him here for placement. He is medically cleared.    Audree CamelScott T Miah Boye, MD 06/28/13 [phone]

## 2013-06-28 NOTE — ED Notes (Signed)
Per IVC papers: "Respondent has been dx w/ bipolar.  Respondent has mentioned thoughts of suicide.  Respondent stated that he also experienced other triggers for suicidal thoughts, such as tall trees, fast moving vehicles, a clothes line, and pieces of steel and glass.  Respondent has a history of alcohol abuse and is a cutter.  [facility].  Respondent appears to be a danger to himself."

## 2013-06-28 NOTE — ED Notes (Signed)
Patient is in blue scrubs and yellow socks.  He has been wanded by security.  He has three personal belonging bags

## 2013-06-28 NOTE — ED Notes (Signed)
Patient escorted to the Unit by hospital staff.  Patient gait is steady.  He denies SI although he mimics "the correct way to cut wrists or neck to commit suicide".    He denies desire to hurt others stating "I love everybody".  Patient originally denied having any problems with gastrointestinal system.  Pt is now stating that he wants a "GI cocktail".  He reports receiving the medication in the past.  Patient denies having VH/AH.  He quickly changes from being pleasant to easily agitated and irritable.  He reports being "picked up by the police at the homeless shelter".  However, denies any  behavior that would have resulted in the need for police becoming involved when asked if he was involved in a physical altercation.  He reported drinking "a big beer" earlier in the day.  Patient escalates quickly and begins to scream, yell and continue to move furniture around the room after being asked to refrain.  He told this Clinical research associatewriter that "we have five minutes to get him the GI cocktail before he makes it bad for all of us".  After several threats and the continued aggressive behavior hospital security was contacted.  Security spoke with the patient.  He continued to act out.  He refused PO order Ativan that was order.  He continued to be aggressive but he took the  GI cocktail after it was order.

## 2013-06-28 NOTE — ED Notes (Signed)
Pt states he is unable to urinate at this time.

## 2013-06-29 ENCOUNTER — Encounter (HOSPITAL_COMMUNITY): Payer: Self-pay | Admitting: Registered Nurse

## 2013-06-29 DIAGNOSIS — F1010 Alcohol abuse, uncomplicated: Secondary | ICD-10-CM

## 2013-06-29 DIAGNOSIS — F1994 Other psychoactive substance use, unspecified with psychoactive substance-induced mood disorder: Secondary | ICD-10-CM

## 2013-06-29 NOTE — Consult Note (Signed)
Face to face evaluation and I agree with this note 

## 2013-06-29 NOTE — [facility] Counselor (Signed)
Pt's IVC rescinded by Dr. Taylor.  Originals placed in IVC notebook in Psych ED and copies placed in pt's chart.    Jordy Verba Paige Rayah Fines, LCSWA Assessment Counselor  

## 2013-06-29 NOTE — Consult Note (Signed)
[facility] Face-to-Face Psychiatry Consult   Reason for Consult:  Intoxication and suicidal ideation Referring Physician:  EDp  Bryan Myers is an 43 y.o. male. Total Time spent with patient: 45 minutes  Assessment: AXIS I:  Alcohol Abuse and Substance Induced Mood Disorder AXIS II:  Deferred AXIS III:   Past Medical History  Diagnosis Date  . COPD (chronic obstructive pulmonary disease)   . Hernia of abdominal cavity   . Mental disorder   . Depression   . GERD (gastroesophageal reflux disease)   . H/O hiatal hernia   . Headache(784.0)   . Arthritis    AXIS IV:  other psychosocial or environmental problems and problems related to social environment AXIS V:  51-60 moderate symptoms  Plan:  No evidence of imminent risk to self or others at present.   Patient does not meet criteria for psychiatric inpatient admission. Supportive therapy provided about ongoing stressors. Discussed crisis plan, support from social network, calling 911, coming to the Emergency Department, and calling Suicide Hotline.  Subjective:   Bryan Myers is a 43 y.o. male patient.  HPI:  Patient states "I just need help with getting my medicine adjusted.  Yes I have outpatient; No I did not contact them; [facility] I think.  I can't remember what the name of my medicines are. Yes I have a case worker but she don't do much of a damn thang.  No I don't want to kill my self.  No I don't want to hurt nobody.  I don't know why they brought me here.  I use to drink 10-12 beers a day but I don't anymore that was when I had a drinking problem." Patient denies suicidal/homicidal ideation, psychosis, and paranoia.  Patient states that he takes his medication on a regular basis and follow up at [facility].  Patient stats that he is at the [facility] daily.    HPI Elements:   Location:  suicidal ideation. Quality:  Patient denies that he wants to hurt himself. Severity:  Patient denies that he wants to hurt himself.. Timing:  1 day.  Past  Psychiatric History: Past Medical History  Diagnosis Date  . COPD (chronic obstructive pulmonary disease)   . Hernia of abdominal cavity   . Mental disorder   . Depression   . GERD (gastroesophageal reflux disease)   . H/O hiatal hernia   . Headache(784.0)   . Arthritis     reports that he has been smoking Cigarettes.  He has been smoking about 1.00 pack per day. He does not have any smokeless tobacco history on file. He reports that he drinks alcohol. He reports that he does not use illicit drugs. Family History  Problem Relation Age of Onset  . Heart disease Mother   . Heart disease Father   . Heart disease Sister   . Heart disease Brother    Family History Substance Abuse: No Family Supports: No Living Arrangements: Other (Comment);Alone (Homeless ) Can pt return to current living arrangement?: Yes Abuse/Neglect [facility]) Physical Abuse: Denies Verbal Abuse: Denies Sexual Abuse: Denies Allergies:  No Known Allergies  ACT Assessment Complete:  Yes:    Educational Status    Risk to Self: Risk to self Suicidal Ideation: No-Not Currently/Within Last 6 Months Suicidal Intent: No-Not Currently/Within Last 6 Months Is patient at risk for suicide?: No Suicidal Plan?: No-Not Currently/Within Last 6 Months Specify Current Suicidal Plan: None  Access to Means: No Specify Access to Suicidal Means: None  What has been your use  of drugs/alcohol within the last 12 months?: Abusing: alcohol  Previous Attempts/Gestures: Yes How many times?:  ("Several" ) Other Self Harm Risks: None  Triggers for Past Attempts: Unpredictable Intentional Self Injurious Behavior: None Family Suicide History: No Recent stressful life event(s): Financial Problems;Other (Comment) (Homelessness; SA) Persecutory voices/beliefs?: No Depression: Yes Depression Symptoms: Loss of interest in usual pleasures;Feeling worthless/self pity Substance abuse history and/or treatment for substance abuse?: Yes Suicide  prevention information given to non-admitted patients: Not applicable  Risk to Others: Risk to Others Homicidal Ideation: No Thoughts of Harm to Others: No Current Homicidal Intent: No Current Homicidal Plan: No Access to Homicidal Means: No Identified Victim: None  History of harm to others?: No Assessment of Violence: None Noted Violent Behavior Description: None  Does patient have access to weapons?: No Criminal Charges Pending?: No Does patient have a court date: No  Abuse: Abuse/Neglect Assessment (Assessment to be complete while patient is alone) Physical Abuse: Denies Verbal Abuse: Denies Sexual Abuse: Denies Exploitation of patient/patient's resources: Denies Self-Neglect: Denies  Prior Inpatient Therapy: Prior Inpatient Therapy Prior Inpatient Therapy: Yes Prior Therapy Dates: 2-3 yrs ago, 2015 Prior Therapy Facilty/Provider(s): [place], [facility]  Reason for Treatment: Detox, SI/SA/Depression   Prior Outpatient Therapy: Prior Outpatient Therapy Prior Outpatient Therapy: No Prior Therapy Dates: None  Prior Therapy Facilty/Provider(s): None  Reason for Treatment: None   Additional Information: Additional Information 1:1 In Past 12 Months?: No CIRT Risk: No Elopement Risk: No Does patient have medical clearance?: Yes                  Objective: Blood pressure 100/63, pulse 94, temperature 98.1 F (36.7 C), temperature source Oral, resp. rate 18, SpO2 95.00%.There is no weight on file to calculate BMI. Results for orders placed during the hospital encounter of 06/28/13 (from the past 72 hour(s))  CBC     Status: None   Collection Time    06/28/13  6:10 PM      Result Value Ref Range   WBC 9.0  4.0 - 10.5 K/uL   RBC 4.61  4.22 - 5.81 MIL/uL   Hemoglobin 14.3  13.0 - 17.0 g/dL   HCT 42.5  39.0 - 52.0 %   MCV 92.2  78.0 - 100.0 fL   MCH 31.0  26.0 - 34.0 pg   MCHC 33.6  30.0 - 36.0 g/dL   RDW 14.4  11.5 - 15.5 %   Platelets 395  150 - 400 K/uL   COMPREHENSIVE METABOLIC PANEL     Status: Abnormal   Collection Time    06/28/13  6:10 PM      Result Value Ref Range   Sodium 142  137 - 147 mEq/L   Potassium 4.2  3.7 - 5.3 mEq/L   Chloride 103  96 - 112 mEq/L   CO2 25  19 - 32 mEq/L   Glucose, Bld 89  70 - 99 mg/dL   BUN 7  6 - 23 mg/dL   Creatinine, Ser 0.76  0.50 - 1.35 mg/dL   Calcium 9.3  8.4 - 10.5 mg/dL   Total Protein 7.5  6.0 - 8.3 g/dL   Albumin 4.0  3.5 - 5.2 g/dL   AST 27  0 - 37 U/L   Comment: SLIGHT HEMOLYSIS   ALT 35  0 - 53 U/L   Alkaline Phosphatase 90  39 - 117 U/L   Total Bilirubin <0.2 (*) 0.3 - 1.2 mg/dL   GFR calc non Af Amer >90  >  90 mL/min   GFR calc Af Amer >90  >90 mL/min   Comment: (NOTE)     The eGFR has been calculated using the CKD EPI equation.     This calculation has not been validated in all clinical situations.     eGFR's persistently <90 mL/min signify possible Chronic Kidney     Disease.  ETHANOL     Status: Abnormal   Collection Time    06/28/13  6:10 PM      Result Value Ref Range   Alcohol, Ethyl (B) 146 (*) 0 - 11 mg/dL   Comment:            LOWEST DETECTABLE LIMIT FOR     SERUM ALCOHOL IS 11 mg/dL     FOR MEDICAL PURPOSES ONLY  SALICYLATE LEVEL     Status: Abnormal   Collection Time    06/28/13  6:20 PM      Result Value Ref Range   Salicylate Lvl <8.1 (*) 2.8 - 20.0 mg/dL  ACETAMINOPHEN LEVEL     Status: None   Collection Time    06/28/13  6:20 PM      Result Value Ref Range   Acetaminophen (Tylenol), Serum <15.0  10 - 30 ug/mL   Comment:            THERAPEUTIC CONCENTRATIONS VARY     SIGNIFICANTLY. A RANGE OF 10-30     ug/mL MAY BE AN EFFECTIVE     CONCENTRATION FOR MANY PATIENTS.     HOWEVER, SOME ARE BEST TREATED     AT CONCENTRATIONS OUTSIDE THIS     RANGE.     ACETAMINOPHEN CONCENTRATIONS     >150 ug/mL AT 4 HOURS AFTER     INGESTION AND >50 ug/mL AT 12     HOURS AFTER INGESTION ARE     OFTEN ASSOCIATED WITH TOXIC     REACTIONS.  URINE RAPID DRUG SCREEN  (HOSP PERFORMED)     Status: None   Collection Time    06/28/13  6:43 PM      Result Value Ref Range   Opiates NONE DETECTED  NONE DETECTED   Cocaine NONE DETECTED  NONE DETECTED   Benzodiazepines NONE DETECTED  NONE DETECTED   Amphetamines NONE DETECTED  NONE DETECTED   Tetrahydrocannabinol NONE DETECTED  NONE DETECTED   Barbiturates NONE DETECTED  NONE DETECTED   Comment:            DRUG SCREEN FOR MEDICAL PURPOSES     ONLY.  IF CONFIRMATION IS NEEDED     FOR ANY PURPOSE, NOTIFY LAB     WITHIN 5 DAYS.                LOWEST DETECTABLE LIMITS     FOR URINE DRUG SCREEN     Drug Class       Cutoff (ng/mL)     Amphetamine      1000     Barbiturate      200     Benzodiazepine   017     Tricyclics       510     Opiates          300     Cocaine          300     THC              50   Labs are reviewed and are pertinent for the assessment of ETOH, illicit drug use,  and other medical issues. Medication reviewed no changes made.   Current Facility-Administered Medications  Medication Dose Route Frequency Provider Last Rate Last Dose  . LORazepam (ATIVAN) injection 2 mg  2 mg Intravenous Q6H PRN Laverle Hobby, PA-C   2 mg at 06/28/13 2015  . LORazepam (ATIVAN) tablet 0-4 mg  0-4 mg Oral 4 times per day Ephraim Hamburger, MD       Followed by  . [START ON 06/30/2013] LORazepam (ATIVAN) tablet 0-4 mg  0-4 mg Oral Q12H Ephraim Hamburger, MD      . thiamine (VITAMIN B-1) tablet 100 mg  100 mg Oral Daily Ephraim Hamburger, MD       Or  . thiamine (B-1) injection 100 mg  100 mg Intravenous Daily Ephraim Hamburger, MD       Current Outpatient Prescriptions  Medication Sig Dispense Refill  . ibuprofen (ADVIL,MOTRIN) 200 MG tablet You can take 2-3 of these every 6 hours as needed for pain.  You can use this or tylenol every 6 hours.  30 tablet  0  . mirtazapine (REMERON) 45 MG tablet Take 1 tablet (45 mg total) by mouth at bedtime. For sleep/depression  30 tablet  0  . oxyCODONE-acetaminophen  (PERCOCET/ROXICET) 5-325 MG per tablet Take 1-2 tablets by mouth every 4 (four) hours as needed for moderate pain.      . pantoprazole (PROTONIX) 40 MG tablet Take 1 tablet (40 mg total) by mouth 2 (two) times daily. For acid reflux  60 tablet  0  . traZODone (DESYREL) 50 MG tablet Take 50 mg by mouth at bedtime. Take 75 mg Q bedtime: For sleep      . traMADol (ULTRAM) 50 MG tablet Take 2 tablets (100 mg total) by mouth every 6 (six) hours as needed.  45 tablet  0    Psychiatric Specialty Exam:     Blood pressure 100/63, pulse 94, temperature 98.1 F (36.7 C), temperature source Oral, resp. rate 18, SpO2 95.00%.There is no weight on file to calculate BMI.  General Appearance: Disheveled  Eye Contact::  Good  Speech:  Clear and Coherent and Normal Rate  Volume:  Normal  Mood:  Anxious  Affect:  Congruent  Thought Process:  Circumstantial  Orientation:  Full (Time, Place, and Person)  Thought Content:  "I'm ready to get out of here"  Suicidal Thoughts:  No  Homicidal Thoughts:  No  Memory:  Immediate;   Poor Recent;   Poor  Judgement:  Poor  Insight:  Lacking  Psychomotor Activity:  Normal  Concentration:  Poor  Recall:  Poor  Fund of Knowledge:Poor  Language: Poor  Akathisia:  No  Handed:  Right  AIMS (if indicated):     Assets:  Desire for Improvement  Sleep:      Musculoskeletal: Strength & Muscle Tone: within normal limits Gait & Station: normal Patient leans: N/A  Treatment Plan Summary: Outpatient provider  Disposition:  Discharge home recommended.  Patient to follow up with [facility]  Discharge Assessment     Demographic Factors:  Male and Caucasian  Total Time spent with patient: 15 minutes  Psychiatric Specialty Exam: Same as above Mental Status Per Nursing Assessment::   On Admission:     Current Mental Status by Physician: NA and Patient denies suididal/homicidal ideation, psychosis, and paranoia  Loss Factors: NA  Historical  Factors: NA  Risk Reduction Factors:   Positive therapeutic relationship and NA  Continued Clinical Symptoms:  Alcohol/Substance Abuse/Dependencies  Cognitive Features That Contribute To Risk:  Closed-mindedness    Suicide Risk:  Minimal: No identifiable suicidal ideation.  Patients presenting with no risk factors but with morbid ruminations; may be classified as minimal risk based on the severity of the depressive symptoms  Discharge Diagnoses: Same as above diagnosis   Plan Of Care/Follow-up recommendations:  Activity:  Resume usual activity Diet:  Resume usual diet  Is patient on multiple antipsychotic therapies at discharge:  No   Has Patient had three or more failed trials of antipsychotic monotherapy by history:  No  Recommended Plan for Multiple Antipsychotic Therapies: NA  Ila Landowski 06/29/2013 12:12 PM

## 2013-06-29 NOTE — ED Notes (Signed)
Patient has been resting this am.  UTA at present.  Respirations even and unlabored.

## 2013-06-29 NOTE — Discharge Instructions (Signed)
Alcohol Use Disorder °Alcohol use disorder is a mental disorder. It is not a one-time incident of heavy drinking. Alcohol use disorder is the excessive and uncontrollable use of alcohol over time that leads to problems with functioning in one or more areas of daily living. People with this disorder risk harming themselves and others when they drink to excess. Alcohol use disorder also can cause other mental disorders, such as mood and anxiety disorders, and serious physical problems. People with alcohol use disorder often misuse other drugs.  °Alcohol use disorder is common and widespread. Some people with this disorder drink alcohol to cope with or escape from negative life events. Others drink to relieve chronic pain or symptoms of mental illness. People with a family history of alcohol use disorder are at higher risk of losing control and using alcohol to excess.  °SYMPTOMS  °Signs and symptoms of alcohol use disorder may include the following:  °· Consumption of alcohol in larger amounts or over a longer period of time than intended. °· Multiple unsuccessful attempts to cut down or control alcohol use.   °· A great deal of time spent obtaining alcohol, using alcohol, or recovering from the effects of alcohol (hangover). °· A strong desire or urge to use alcohol (cravings).   °· Continued use of alcohol despite problems at work, school, or home because of alcohol use.   °· Continued use of alcohol despite problems in relationships because of alcohol use. °· Continued use of alcohol in situations when it is physically hazardous, such as driving a car. °· Continued use of alcohol despite awareness of a physical or psychological problem that is likely related to alcohol use. Physical problems related to alcohol use can involve the brain, heart, liver, stomach, and intestines. Psychological problems related to alcohol use include intoxication, depression, anxiety, psychosis, delirium, and dementia.   °· The need for  increased amounts of alcohol to achieve the same desired effect, or a decreased effect from the consumption of the same amount of alcohol (tolerance). °· Withdrawal symptoms upon reducing or stopping alcohol use, or alcohol use to reduce or avoid withdrawal symptoms. Withdrawal symptoms include: °· Racing heart. °· Hand tremor. °· Difficulty sleeping. °· Nausea. °· Vomiting. °· Hallucinations. °· Restlessness. °· Seizures. °DIAGNOSIS °Alcohol use disorder is diagnosed through an assessment by your caregiver. Your caregiver may start by asking three or four questions to screen for excessive or problematic alcohol use. To confirm a diagnosis of alcohol use disorder, at least two symptoms (see SYMPTOMS) must be present within a 12-month period. The severity of alcohol use disorder depends on the number of symptoms: °· Mild two or three. °· Moderate four or five. °· Severe six or more. °Your caregiver may perform a physical exam or use results from lab tests to see if you have physical problems resulting from alcohol use. Your caregiver may refer you to a mental health professional for evaluation. °TREATMENT  °Some people with alcohol use disorder are able to reduce their alcohol use to low-risk levels. Some people with alcohol use disorder need to quit drinking alcohol. When necessary, mental health professionals with specialized training in substance use treatment can help. Your caregiver can help you decide how severe your alcohol use disorder is and what type of treatment you need. The following forms of treatment are available:  °· Detoxification. Detoxification involves the use of prescription medication to prevent alcohol withdrawal symptoms in the first week after quitting. This is important for people with a history of symptoms of withdrawal and for heavy   drinkers who are likely to have withdrawal symptoms. Alcohol withdrawal can be dangerous and, in severe cases, cause death. Detoxification is usually provided  in a hospital or in-patient substance use treatment facility.  Counseling or talk therapy. Talk therapy is provided by substance use treatment counselors. It addresses the reasons people use alcohol and ways to keep them from drinking again. The goals of talk therapy are to help people with alcohol use disorder find healthy activities and ways to cope with life stress, to identify and avoid triggers for alcohol use, and to handle cravings, which can cause relapse.  Medication.Different medications can help treat alcohol use disorder through the following actions:  Decrease alcohol cravings.  Decrease the positive reward response felt from alcohol use.  Produce an uncomfortable physical reaction when alcohol is used (aversion therapy).  Support groups. Support groups are run by people who have quit drinking. They provide emotional support, advice, and guidance. These forms of treatment are often combined. Some people with alcohol use disorder benefit from intensive combination treatment provided by specialized substance use treatment centers. Both inpatient and outpatient treatment programs are available. Document Released: 05/22/2004 Document Revised: 12/15/2012 Document Reviewed: 07/22/2012 [facility] Patient Information 2014 [employer] [place]Care, [place].  Alcohol Problems Most adults who drink alcohol drink in moderation (not a lot) are at low risk for developing problems related to their drinking. However, all drinkers, including low-risk drinkers, should know about the health risks connected with drinking alcohol. RECOMMENDATIONS FOR LOW-RISK DRINKING  Drink in moderation. Moderate drinking is defined as follows:   Men - no more than 2 drinks per day.  Nonpregnant women - no more than 1 drink per day.  Over age 44 - no more than 1 drink per day. A standard drink is 12 grams of pure alcohol, which is equal to a 12 ounce bottle of beer or wine cooler, a 5 ounce glass of wine, or 1.5 ounces of  distilled spirits (such as whiskey, brandy, vodka, or rum).  ABSTAIN FROM (DO NOT DRINK) ALCOHOL:  When pregnant or considering pregnancy.  When taking a medication that interacts with alcohol.  If you are alcohol dependent.  A medical condition that prohibits drinking alcohol (such as ulcer, liver disease, or heart disease). DISCUSS WITH YOUR CAREGIVER:  If you are at risk for coronary heart disease, discuss the potential benefits and risks of alcohol use: Light to moderate drinking is associated with lower rates of coronary heart disease in certain populations (for example, men over age 44 and postmenopausal women). Infrequent or nondrinkers are advised not to begin light to moderate drinking to reduce the risk of coronary heart disease so as to avoid creating an alcohol-related problem. Similar protective effects can likely be gained through proper diet and exercise.  Women and the elderly have smaller amounts of body water than men. As a result women and the elderly achieve a higher blood alcohol concentration after drinking the same amount of alcohol.  Exposing a fetus to alcohol can cause a broad range of birth defects referred to as Fetal Alcohol Syndrome (FAS) or Alcohol-Related Birth Defects (ARBD). Although FAS/ARBD is connected with excessive alcohol consumption during pregnancy, studies also have reported neurobehavioral problems in infants born to mothers reporting drinking an average of 1 drink per day during pregnancy.  Heavier drinking (the consumption of more than 4 drinks per occasion by men and more than 3 drinks per occasion by women) impairs learning (cognitive) and psychomotor functions and increases the risk of alcohol-related problems, including accidents  and injuries. CAGE QUESTIONS:   Have you ever felt that you should Cut down on your drinking?  Have people Annoyed you by criticizing your drinking?  Have you ever felt bad or Guilty about your drinking?  Have you  ever had a drink first thing in the morning to steady your nerves or get rid of a hangover (Eye opener)? If you answered positively to any of these questions: You may be at risk for alcohol-related problems if alcohol consumption is:   Men: Greater than 14 drinks per week or more than 4 drinks per occasion.  Women: Greater than 7 drinks per week or more than 3 drinks per occasion. Do you or your family have a medical history of alcohol-related problems, such as:  Blackouts.  Sexual dysfunction.  Depression.  Trauma.  Liver dysfunction.  Sleep disorders.  Hypertension.  Chronic abdominal pain.  Has your drinking ever caused you problems, such as problems with your family, problems with your work (or school) performance, or accidents/injuries?  Do you have a compulsion to drink or a preoccupation with drinking?  Do you have poor control or are you unable to stop drinking once you have started?  Do you have to drink to avoid withdrawal symptoms?  Do you have problems with withdrawal such as tremors, nausea, sweats, or mood disturbances?  Does it take more alcohol than in the past to get you high?  Do you feel a strong urge to drink?  Do you change your plans so that you can have a drink?  Do you ever drink in the morning to relieve the shakes or a hangover? If you have answered a number of the previous questions positively, it may be time for you to talk to your caregivers, family, and friends and see if they think you have a problem. Alcoholism is a chemical dependency that keeps getting worse and will eventually destroy your health and relationships. Many alcoholics end up dead, impoverished, or in prison. This is often the end result of all chemical dependency.  Do not be discouraged if you are not ready to take action immediately.  Decisions to change behavior often involve up and down desires to change and feeling like you cannot decide.  Try to think more seriously  about your drinking behavior.  Think of the reasons to quit. WHERE TO GO FOR ADDITIONAL INFORMATION   The National Institute on Alcohol Abuse and Alcoholism (NIAAA) BasicStudents.dkwww.niaaa.nih.gov  [employer] on Alcoholism and Drug Dependence (NCADD) www.ncadd.org  American Society of Addiction Medicine (ASAM) RoyalDiary.glwww.asam.org  Document Released: 04/14/2005 Document Revised: 07/07/2011 Document Reviewed: 12/01/2007 [facility] Patient Information 2014 [place]ExitCare, [place]LL[place].

## 2013-06-29 NOTE — BH Assessment (Signed)
Tele Assessment Note   Bryan Myers is a 43 y.o. male who presents via IVC petition initiated by [facility] Social Work dept and picked up by [employer] from the homeless shelter.  This Clinical research associate attempted to interview pt, but he was not forthcoming with this Clinical research associate. The following information is collateral. Pt was brought to emerg dept by [employer] with SI, no plan to harm self.  Pt stated that he thoughts of harming himself while walking over a bridge and SI thoughts increased because he was intoxicated.  Pt told medical staff that he also had other SI triggers, e.g. Tall trees, fast moving vehicles, clothes line and pieces of steel and glass.  Pt admits SA, drinking 10-12, 12oz cans of beer daily, last drink was 06/28/13.  Pt consumed 10 beers.  Pt now denies SI thoughts and is requesting d/c from hospital.  Pt denies HI/AVH.  Axis I: Bipolar I disorder, Current or most recent episode depressed; Alcohol use disorder, Severe  Axis II: Deferred Axis III:  Past Medical History  Diagnosis Date  . COPD (chronic obstructive pulmonary disease)   . Hernia of abdominal cavity   . Mental disorder   . Depression   . GERD (gastroesophageal reflux disease)   . H/O hiatal hernia   . Headache(784.0)   . Arthritis    Axis IV: housing problems, other psychosocial or environmental problems, problems related to social environment and problems with primary support group Axis V: 31-40 impairment in reality testing  Past Medical History:  Past Medical History  Diagnosis Date  . COPD (chronic obstructive pulmonary disease)   . Hernia of abdominal cavity   . Mental disorder   . Depression   . GERD (gastroesophageal reflux disease)   . H/O hiatal hernia   . Headache(784.0)   . Arthritis     Past Surgical History  Procedure Laterality Date  . 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    . Inguinal hernia repair Left 06/14/2013    Procedure: HERNIA REPAIR INGUINAL INCARCERATED;  Surgeon:  Shelly Rubenstein, MD;  Location: [facility];  Service: General;  Laterality: Left;  . Insertion of mesh Left 06/14/2013    Procedure: INSERTION OF MESH;  Surgeon: Shelly Rubenstein, MD;  Location: [facility];  Service: General;  Laterality: Left;    Family History:  Family History  Problem Relation Age of Onset  . Heart disease Mother   . Heart disease Father   . Heart disease Sister   . Heart disease Brother     Social History:  reports that he has been smoking Cigarettes.  He has been smoking about 1.00 pack per day. He does not have any smokeless tobacco history on file. He reports that he drinks alcohol. He reports that he does not use illicit drugs.  Additional Social History:  Alcohol / Drug Use Pain Medications: See MAR  Prescriptions: See MAR  Over the Counter: See MAR  History of alcohol / drug use?: Yes Longest period of sobriety (when/how long): None  Negative Consequences of Use: Work / School;Personal relationships;Financial Withdrawal Symptoms: Other (Comment) (No current w/d sxs ) Substance #1 Name of Substance 1: Alcohol  1 - Age of First Use: Teens  1 - Amount (size/oz): 10-12 12oz Beers  1 - Frequency: Daily  1 - Duration: On-going  1 - Last Use / Amount: 06/28/13  CIWA: CIWA-Ar BP: 100/63 mmHg Pulse Rate: 94 Nausea and Vomiting: no nausea and no vomiting Tactile  Disturbances: none Tremor: no tremor Auditory Disturbances: not present Paroxysmal Sweats: no sweat visible Visual Disturbances: not present Anxiety: three Headache, Fullness in Head: none present Agitation: two Orientation and Clouding of Sensorium: oriented and can do serial additions CIWA-Ar Total: 5 COWS:    Allergies: No Known Allergies  Home Medications:  (Not in a hospital admission)  OB/GYN Status:  No LMP for male patient.  General Assessment Data [facility] Is this a Tele or Face-to-Face Assessment?: Tele Assessment Is this an Initial Assessment or a  Re-assessment for this encounter?: Initial Assessment Living Arrangements: Other (Comment);Alone (Homeless ) Can pt return to current living arrangement?: Yes Admission Status: Voluntary Is patient capable of signing voluntary admission?: Yes Transfer from: Acute Hospital Referral Source: MD  Medical Screening Exam [facility] Walk-in ONLY) Medical Exam completed: No Reason for MSE not completed: Other: (None )  [facility] Living Arrangements: Other (Comment);Alone (Homeless ) Name of Psychiatrist: None  Name of Therapist: None   Education Status Is patient currently in school?: No Current Grade: None  Highest grade of school patient has completed: Some College  Name of school: None Contact person: None   Risk to self Suicidal Ideation: No-Not Currently/Within Last 6 Months Suicidal Intent: No-Not Currently/Within Last 6 Months Is patient at risk for suicide?: No Suicidal Plan?: No-Not Currently/Within Last 6 Months Specify Current Suicidal Plan: None  Access to Means: No Specify Access to Suicidal Means: None  What has been your use of drugs/alcohol within the last 12 months?: Abusing: alcohol  Previous Attempts/Gestures: Yes How many times?:  ("Several" ) Other Self Harm Risks: None  Triggers for Past Attempts: Unpredictable Intentional Self Injurious Behavior: None Family Suicide History: No Recent stressful life event(s): Financial Problems;Other (Comment) (Homelessness; SA) Persecutory voices/beliefs?: No Depression: Yes Depression Symptoms: Loss of interest in usual pleasures;Feeling worthless/self pity Substance abuse history and/or treatment for substance abuse?: Yes Suicide prevention information given to non-admitted patients: Not applicable  Risk to Others Homicidal Ideation: No Thoughts of Harm to Others: No Current Homicidal Intent: No Current Homicidal Plan: No Access to Homicidal Means: No Identified Victim: None  History of harm to others?:  No Assessment of Violence: None Noted Violent Behavior Description: None  Does patient have access to weapons?: No Criminal Charges Pending?: No Does patient have a court date: No  Psychosis Hallucinations: None noted Delusions: None noted  Mental Status Report Appear/Hygiene: Disheveled;Poor hygiene Eye Contact: Poor Motor Activity: Unremarkable Speech: Logical/coherent;Slow;Slurred Level of Consciousness: Drowsy Mood: Irritable Affect: Irritable;Appropriate to circumstance Anxiety Level: None Thought Processes: Relevant Judgement: Unimpaired Orientation: Person;Place;Time;Situation Obsessive Compulsive Thoughts/Behaviors: None  Cognitive Functioning Concentration: Normal Memory: Recent Intact;Remote Intact IQ: Average Insight: Fair Impulse Control: Fair Appetite: Fair Weight Loss: 0 Weight Gain: 0 Sleep: Decreased Total Hours of Sleep: 5 Vegetative Symptoms: None  ADLScreening [facility] Assessment Services) Patient's cognitive ability adequate to safely complete daily activities?: Yes Patient able to express need for assistance with ADLs?: Yes Independently performs ADLs?: Yes (appropriate for developmental age)  Prior Inpatient Therapy Prior Inpatient Therapy: Yes Prior Therapy Dates: 2-3 yrs ago, 2015 [facility] [place]  Reason for Treatment: Detox, SI/SA/Depression   Prior Outpatient Therapy Prior Outpatient Therapy: No Prior Therapy Dates: None  Prior Therapy Facilty/Provider(s): None  Reason for Treatment: None   ADL Screening (condition at time of admission) Patient's cognitive ability adequate to safely complete daily activities?: Yes Is the patient deaf or have difficulty hearing?: No Does the patient have difficulty seeing,  even when wearing glasses/contacts?: No Does the patient have difficulty concentrating, remembering, or making decisions?: No Patient able to express need for assistance with ADLs?: Yes Does the patient  have difficulty dressing or bathing?: No Independently performs ADLs?: Yes (appropriate for developmental age) Does the patient have difficulty walking or climbing stairs?: No Weakness of Legs: None Weakness of Arms/Hands: None  Home Assistive Devices/Equipment Home Assistive Devices/Equipment: None  Therapy Consults (therapy consults require a physician order) PT Evaluation Needed: No OT Evalulation Needed: No SLP Evaluation Needed: No Abuse/Neglect Assessment (Assessment to be complete while patient is alone) Physical Abuse: Denies Verbal Abuse: Denies Sexual Abuse: Denies Exploitation of patient/patient's resources: Denies Self-Neglect: Denies Values / Beliefs Cultural Requests During Hospitalization: None Spiritual Requests During Hospitalization: None Consults Spiritual Care Consult Needed: No Social Work Consult Needed: No Merchant navy officer (For Healthcare) Advance Directive: Patient does not have advance directive;Patient would not like information Pre-existing out of facility DNR order (yellow form or pink MOST form): No Nutrition Screen- [facility] Patient's home diet: Regular  Additional Information 1:1 In Past 12 Months?: No CIRT Risk: No Elopement Risk: No Does patient have medical clearance?: Yes     Disposition:  Disposition Initial Assessment Completed for this Encounter: Yes Disposition of Patient: Referred to (Pending AM psych eval for final disposition ) Type of inpatient treatment program: Adult Patient referred to: Other (Comment) (Peding AM psych eval for final disposition )  Murrell Redden 06/29/2013 5:37 AM

## 2013-06-30 ENCOUNTER — Encounter (INDEPENDENT_AMBULATORY_CARE_PROVIDER_SITE_OTHER): Payer: Self-pay | Admitting: Surgery

## 2013-07-01 ENCOUNTER — Telehealth: Payer: Self-pay | Admitting: *Deleted

## 2013-07-01 NOTE — Telephone Encounter (Signed)
Message copied by Viney Acocella, [place] R on Fri Jul 01, 2013  2:18 PM ------      Message from: Susie CassetteABROL MD, [facility]      Created: Fri Jul 01, 2013 10:01 AM       Notify patient of the labs are normal ------

## 2013-07-01 NOTE — Telephone Encounter (Signed)
Unable to reach patient. Voicemail box has not been set up yet. Unable to leave a voicemail for patient.

## 2013-07-04 ENCOUNTER — Telehealth: Payer: Self-pay | Admitting: Emergency Medicine

## 2013-07-04 NOTE — Telephone Encounter (Signed)
Message copied by Darlis LoanSMITH, Moritz Lever D on Mon Jul 04, 2013 11:05 AM ------      Message from: Susie CassetteABROL MD, Germain OsgoodNAYANA      Created: Fri Jul 01, 2013 10:01 AM       Notify patient of the labs are normal ------

## 2013-07-06 ENCOUNTER — Encounter (HOSPITAL_COMMUNITY): Payer: Self-pay | Admitting: Emergency Medicine

## 2013-07-06 ENCOUNTER — Emergency Department (HOSPITAL_COMMUNITY)
Admission: EM | Admit: 2013-07-06 | Discharge: 2013-07-07 | Disposition: A | Discharge status: Home / Self Care | Payer: Self-pay | Attending: Emergency Medicine | Admitting: Emergency Medicine

## 2013-07-06 DIAGNOSIS — F1994 Other psychoactive substance use, unspecified with psychoactive substance-induced mood disorder: Secondary | ICD-10-CM

## 2013-07-06 DIAGNOSIS — K219 Gastro-esophageal reflux disease without esophagitis: Secondary | ICD-10-CM | POA: Insufficient documentation

## 2013-07-06 DIAGNOSIS — F17200 Nicotine dependence, unspecified, uncomplicated: Secondary | ICD-10-CM | POA: Insufficient documentation

## 2013-07-06 DIAGNOSIS — J4489 Other specified chronic obstructive pulmonary disease: Secondary | ICD-10-CM | POA: Insufficient documentation

## 2013-07-06 DIAGNOSIS — R109 Unspecified abdominal pain: Secondary | ICD-10-CM | POA: Insufficient documentation

## 2013-07-06 DIAGNOSIS — Z9889 Other specified postprocedural states: Secondary | ICD-10-CM | POA: Insufficient documentation

## 2013-07-06 DIAGNOSIS — K4030 Unilateral inguinal hernia, with obstruction, without gangrene, not specified as recurrent: Secondary | ICD-10-CM

## 2013-07-06 DIAGNOSIS — F3289 Other specified depressive episodes: Secondary | ICD-10-CM | POA: Insufficient documentation

## 2013-07-06 DIAGNOSIS — R143 Flatulence: Secondary | ICD-10-CM

## 2013-07-06 DIAGNOSIS — F32A Depression, unspecified: Secondary | ICD-10-CM

## 2013-07-06 DIAGNOSIS — Z79899 Other long term (current) drug therapy: Secondary | ICD-10-CM | POA: Insufficient documentation

## 2013-07-06 DIAGNOSIS — F4310 Post-traumatic stress disorder, unspecified: Secondary | ICD-10-CM

## 2013-07-06 DIAGNOSIS — R141 Gas pain: Secondary | ICD-10-CM | POA: Insufficient documentation

## 2013-07-06 DIAGNOSIS — F329 Major depressive disorder, single episode, unspecified: Secondary | ICD-10-CM

## 2013-07-06 DIAGNOSIS — F1020 Alcohol dependence, uncomplicated: Secondary | ICD-10-CM

## 2013-07-06 DIAGNOSIS — J449 Chronic obstructive pulmonary disease, unspecified: Secondary | ICD-10-CM | POA: Insufficient documentation

## 2013-07-06 DIAGNOSIS — Z8739 Personal history of other diseases of the musculoskeletal system and connective tissue: Secondary | ICD-10-CM | POA: Insufficient documentation

## 2013-07-06 DIAGNOSIS — R509 Fever, unspecified: Secondary | ICD-10-CM | POA: Insufficient documentation

## 2013-07-06 DIAGNOSIS — R45851 Suicidal ideations: Secondary | ICD-10-CM | POA: Insufficient documentation

## 2013-07-06 DIAGNOSIS — R142 Eructation: Secondary | ICD-10-CM | POA: Insufficient documentation

## 2013-07-06 DIAGNOSIS — F1010 Alcohol abuse, uncomplicated: Secondary | ICD-10-CM | POA: Insufficient documentation

## 2013-07-06 LAB — CBC WITH DIFFERENTIAL/PLATELET
Basophils Absolute: 0 10*3/uL (ref 0.0–0.1)
Basophils Relative: 0 % (ref 0–1)
EOS ABS: 0.4 10*3/uL (ref 0.0–0.7)
EOS PCT: 4 % (ref 0–5)
HEMATOCRIT: 42.9 % (ref 39.0–52.0)
HEMOGLOBIN: 14.5 g/dL (ref 13.0–17.0)
LYMPHS ABS: 3 10*3/uL (ref 0.7–4.0)
Lymphocytes Relative: 34 % (ref 12–46)
MCH: 31 pg (ref 26.0–34.0)
MCHC: 33.8 g/dL (ref 30.0–36.0)
MCV: 91.7 fL (ref 78.0–100.0)
Monocytes Absolute: 0.7 10*3/uL (ref 0.1–1.0)
Monocytes Relative: 7 % (ref 3–12)
Neutro Abs: 4.9 10*3/uL (ref 1.7–7.7)
Neutrophils Relative %: 55 % (ref 43–77)
Platelets: 310 10*3/uL (ref 150–400)
RBC: 4.68 MIL/uL (ref 4.22–5.81)
RDW: 14.5 % (ref 11.5–15.5)
WBC: 9.1 10*3/uL (ref 4.0–10.5)

## 2013-07-06 LAB — RAPID URINE DRUG SCREEN, HOSP PERFORMED
AMPHETAMINES: NOT DETECTED
Barbiturates: NOT DETECTED
Benzodiazepines: NOT DETECTED
Cocaine: NOT DETECTED
Opiates: NOT DETECTED
TETRAHYDROCANNABINOL: NOT DETECTED

## 2013-07-06 LAB — COMPREHENSIVE METABOLIC PANEL
ALK PHOS: 97 U/L (ref 39–117)
ALT: 28 U/L (ref 0–53)
AST: 29 U/L (ref 0–37)
Albumin: 4 g/dL (ref 3.5–5.2)
BUN: 5 mg/dL — ABNORMAL LOW (ref 6–23)
CALCIUM: 9.1 mg/dL (ref 8.4–10.5)
CO2: 24 meq/L (ref 19–32)
Chloride: 104 mEq/L (ref 96–112)
Creatinine, Ser: 0.8 mg/dL (ref 0.50–1.35)
GLUCOSE: 101 mg/dL — AB (ref 70–99)
Potassium: 4 mEq/L (ref 3.7–5.3)
Sodium: 143 mEq/L (ref 137–147)
Total Bilirubin: <0.2 mg/dL — ABNORMAL LOW (ref 0.3–1.2)
Total Protein: 7.3 g/dL (ref 6.0–8.3)

## 2013-07-06 NOTE — ED Notes (Addendum)
Pt reports lower abd pain associated to post op hernia repair - pain began yesterday, surgical site appears to be healing well, no erythema, drainage or swelling - pt also admits to recent low-grade fever. Pt denies SI/HI, pt also admits to "a few beers tonight" smells of ETOH

## 2013-07-06 NOTE — ED Notes (Signed)
Pt and belongings have been wanded 

## 2013-07-06 NOTE — ED Notes (Addendum)
Pt comes to [facility] stating that he wants to walk out in front of traffic. Pt could not explain why he want to walk into traffic. Pt asked to stay inside so that I can see him at all times. Pt gets upset and says that he needs to be left alone and continues to walk out. Pt comes back in and sits in waiting area.

## 2013-07-07 ENCOUNTER — Encounter (HOSPITAL_COMMUNITY): Payer: Self-pay | Admitting: Registered Nurse

## 2013-07-07 DIAGNOSIS — F1994 Other psychoactive substance use, unspecified with psychoactive substance-induced mood disorder: Secondary | ICD-10-CM

## 2013-07-07 DIAGNOSIS — R45851 Suicidal ideations: Secondary | ICD-10-CM

## 2013-07-07 DIAGNOSIS — F3289 Other specified depressive episodes: Secondary | ICD-10-CM

## 2013-07-07 DIAGNOSIS — F329 Major depressive disorder, single episode, unspecified: Secondary | ICD-10-CM | POA: Diagnosis present

## 2013-07-07 DIAGNOSIS — F1010 Alcohol abuse, uncomplicated: Secondary | ICD-10-CM

## 2013-07-07 LAB — SALICYLATE LEVEL: Salicylate Lvl: <2 mg/dL — ABNORMAL LOW (ref 2.8–20.0)

## 2013-07-07 LAB — ACETAMINOPHEN LEVEL: Acetaminophen (Tylenol), Serum: <15 ug/mL (ref 10–30)

## 2013-07-07 LAB — ETHANOL: Alcohol, Ethyl (B): 237 mg/dL — ABNORMAL HIGH (ref 0–11)

## 2013-07-07 MED ORDER — CHLORDIAZEPOXIDE HCL 25 MG PO CAPS
50.0000 mg | ORAL_CAPSULE | Freq: Once | ORAL | Status: AC
Start: 2013-07-07 — End: 2013-07-07
  Administered 2013-07-07: 50 mg via ORAL
  Filled 2013-07-07: qty 2

## 2013-07-07 MED ORDER — GI COCKTAIL ~~LOC~~
30.0000 mL | Freq: Once | ORAL | Status: AC
  Administered 2013-07-07: 30 mL via ORAL
  Filled 2013-07-07: qty 30

## 2013-07-07 MED ORDER — VITAMIN B-1 100 MG PO TABS
100.0000 mg | ORAL_TABLET | Freq: Every day | ORAL | Status: DC
  Filled 2013-07-07: qty 1

## 2013-07-07 MED ORDER — CHLORDIAZEPOXIDE HCL 25 MG PO CAPS: 25.0000 mg | ORAL_CAPSULE | ORAL | Status: DC

## 2013-07-07 MED ORDER — CHLORDIAZEPOXIDE HCL 25 MG PO CAPS
25.0000 mg | ORAL_CAPSULE | Freq: Four times a day (QID) | ORAL | Status: DC
  Administered 2013-07-07: 25 mg via ORAL
  Filled 2013-07-07: qty 1

## 2013-07-07 MED ORDER — CHLORDIAZEPOXIDE HCL 25 MG PO CAPS: 25.0000 mg | ORAL_CAPSULE | Freq: Three times a day (TID) | ORAL | Status: DC

## 2013-07-07 MED ORDER — TRAZODONE HCL 50 MG PO TABS: 75.0000 mg | ORAL_TABLET | Freq: Every day | ORAL | Status: DC

## 2013-07-07 MED ORDER — ADULT MULTIVITAMIN W/MINERALS CH
1.0000 | ORAL_TABLET | Freq: Every day | ORAL | Status: DC
Start: 2013-07-07 — End: 2013-07-07
  Administered 2013-07-07: 1 via ORAL
  Filled 2013-07-07: qty 1

## 2013-07-07 MED ORDER — CHLORDIAZEPOXIDE HCL 25 MG PO CAPS: 25.0000 mg | ORAL_CAPSULE | Freq: Four times a day (QID) | ORAL | Status: DC | PRN

## 2013-07-07 MED ORDER — LOPERAMIDE HCL 2 MG PO CAPS: 2.0000 mg | ORAL_CAPSULE | ORAL | Status: DC | PRN

## 2013-07-07 MED ORDER — PANTOPRAZOLE SODIUM 40 MG PO TBEC
40.0000 mg | DELAYED_RELEASE_TABLET | Freq: Every day | ORAL | Status: DC
  Administered 2013-07-07: 40 mg via ORAL
  Filled 2013-07-07: qty 1

## 2013-07-07 MED ORDER — ONDANSETRON 4 MG PO TBDP: 4.0000 mg | ORAL_TABLET | Freq: Four times a day (QID) | ORAL | Status: DC | PRN

## 2013-07-07 MED ORDER — HYDROXYZINE HCL 25 MG PO TABS: 25.0000 mg | ORAL_TABLET | Freq: Four times a day (QID) | ORAL | Status: DC | PRN

## 2013-07-07 MED ORDER — THIAMINE HCL 100 MG/ML IJ SOLN
100.0000 mg | Freq: Once | INTRAMUSCULAR | Status: AC
Start: 2013-07-07 — End: 2013-07-07
  Administered 2013-07-07: 100 mg via INTRAMUSCULAR
  Filled 2013-07-07: qty 2

## 2013-07-07 MED ORDER — CHLORDIAZEPOXIDE HCL 25 MG PO CAPS: 25.0000 mg | ORAL_CAPSULE | Freq: Every day | ORAL | Status: DC

## 2013-07-07 NOTE — BH Assessment (Signed)
[facility] Assessment Progress Note  At the request of Assunta FoundShuvon Rankin, FNP I provided this pt with written referrals to substance abuse rehabilitation facilities and programs, as well as outpatient behavioral health providers.  I included the following providers:  [facility], [address], [place], [place], [phone] [facility], [address], [place], [place] [phone] [facility] [address], [place], [place] [phone] [facility]), [address], [place], [place] [phone], [place], [place] [phone]  I pointed out that the last option will be helpful both for providing ongoing outpatient psychiatric treatment for the pt, as well as providing 24 hour crisis services.  Pt accepted referrals.  Doylene Canninghomas Mirza Kidney, MA Triage Specialist 07/07/2013 @ 16:37

## 2013-07-07 NOTE — Progress Notes (Signed)
[facility] provided pt with a [facility] Orange Card application, highlighting [facility], to help patient establish primary care.  °

## 2013-07-07 NOTE — ED Notes (Signed)
Written dc instructions reviewed w/ pt, pt encouraged to follow up as instructed, and return for any suicidal/homicidal thoughts/urges.  Pt verbalized understanding.  Buss pass given.  Pt ambulatory to dc window w/ mHt, belongings returned after leaving the unit.

## 2013-07-07 NOTE — ED Provider Notes (Signed)
CSN: [id number]     Arrival date & time 07/06/13  2047 History   First MD Initiated Contact with Patient 07/07/13 0320     Chief Complaint  Patient presents with  . Medical Clearance  . Abdominal Pain     (Consider location/radiation/quality/duration/timing/severity/associated sxs/prior Treatment) HPI Comments: She comes to the emergency room originally, stating, that he has abdominal pain, but on arrival.  He, states, that he is depressed, and wants to kill himself by jumping off a bridge.  If the anniversary of his mother's death. Shee died 2 years ago, and Patrick's Day was her favorite hoiliday,  he admits to drinking alcohol as well as beer tonight  The history is provided by the patient.    Past Medical History  Diagnosis Date  . COPD (chronic obstructive pulmonary disease)   . Hernia of abdominal cavity   . Mental disorder   . Depression   . GERD (gastroesophageal reflux disease)   . H/O hiatal hernia   . Headache(784.0)   . Arthritis    Past Surgical History  Procedure Laterality Date  . 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    . Inguinal hernia repair Left 06/14/2013    Procedure: HERNIA REPAIR INGUINAL INCARCERATED;  Surgeon: Shelly Rubensteinouglas A Blackman, MD;  [facility];  Service: General;  Laterality: Left;  . Insertion of mesh Left 06/14/2013    Procedure: INSERTION OF MESH;  Surgeon: Shelly Rubensteinouglas A Blackman, MD;  Location[facility];  Service: General;  Laterality: Left;   Family History  Problem Relation Age of Onset  . Heart disease Mother   . Heart disease Father   . Heart disease Sister   . Heart disease Brother    History  Substance Use Topics  . Smoking status: Current Every Day Smoker -- 1.00 packs/day    Types: Cigarettes  . Smokeless tobacco: Not on file  . Alcohol Use: Yes     Comment: 2-12, 12oz beers, daily     Review of Systems  Constitutional: Positive for fever.  Gastrointestinal: Negative for abdominal pain.   Neurological: Negative for headaches.  Psychiatric/Behavioral: Positive for suicidal ideas.  All other systems reviewed and are negative.      Allergies  Review of patient's allergies indicates no known allergies.  Home Medications   Current Outpatient Rx  Name  Route  Sig  Dispense  Refill  . esomeprazole (NEXIUM) 20 MG capsule   Oral   Take 20 mg by mouth daily.         . traZODone (DESYREL) 50 MG tablet   Oral   Take 75 mg by mouth at bedtime.           BP 90/45  Pulse 58  Temp(Src) 97.6 F (36.4 C) (Oral)  Resp 14  Ht 5\' 9"  (1.753 m)  Wt 130 lb (58.968 kg)  BMI 19.19 kg/m2  SpO2 96% Physical Exam  Nursing note and vitals reviewed. Constitutional: He is oriented to person, place, and time. He appears well-developed and well-nourished.  HENT:  Head: Normocephalic.  Eyes: Pupils are equal, round, and reactive to light.  Neck: Normal range of motion.  Cardiovascular: Normal rate and regular rhythm.   Pulmonary/Chest: Effort normal.  Abdominal: Soft. Bowel sounds are normal. He exhibits distension. There is no tenderness.  Neurological: He is alert and oriented to person, place, and time.  Psychiatric: He expresses impulsivity. He exhibits a depressed mood.    ED Course  Procedures (  including critical care time) Labs Review Labs Reviewed  COMPREHENSIVE METABOLIC PANEL - Abnormal; Notable for the following:    Glucose, Bld 101 (*)    BUN 5 (*)    Total Bilirubin <0.2 (*)    All other components within normal limits  ETHANOL - Abnormal; Notable for the following:    Alcohol, Ethyl (B) 237 (*)    All other components within normal limits  SALICYLATE LEVEL - Abnormal; Notable for the following:    Salicylate Lvl <2.0 (*)    All other components within normal limits  CBC WITH DIFFERENTIAL  ACETAMINOPHEN LEVEL  URINE RAPID DRUG SCREEN (HOSP PERFORMED)   Imaging Review No results found.   EKG Interpretation None      MDM  Since labs have been  reviewed.  He is medically cleared for psychiatric evaluation Final diagnoses:  None         Arman Filter, NP 07/07/13 [phone]

## 2013-07-07 NOTE — ED Notes (Signed)
Pt c/o epigastric pain and requesting a GI cocktail, dr taylor aware.  Repeat gi coctail x1

## 2013-07-07 NOTE — ED Notes (Signed)
Patient denies SI, HI, AVH at present. Patient drowsy/tired. States his abdominal pain is ongoing. States that he does not need an intervention at this time, "I just want to take a nap".   Patient safety maintained. Q 15 checks in place.

## 2013-07-07 NOTE — ED Notes (Signed)
Dr Taylor and shuvon NP into see 

## 2013-07-07 NOTE — Consult Note (Signed)
[facility] Face-to-Face Psychiatry Consult   Reason for Consult:  Depression and suicidal thoughts Referring Physician:  EDP  Bryan Myers is an 43 y.o. male. Total Time spent with patient: 45 minutes  Assessment: AXIS I:  Alcohol Abuse, Depressive Disorder NOS and Substance Induced Mood Disorder AXIS II:  Deferred AXIS III:   Past Medical History  Diagnosis Date  . COPD (chronic obstructive pulmonary disease)   . Hernia of abdominal cavity   . Mental disorder   . Depression   . GERD (gastroesophageal reflux disease)   . H/O hiatal hernia   . Headache(784.0)   . Arthritis    AXIS IV:  economic problems, occupational problems, other psychosocial or environmental problems and problems related to social environment AXIS V:  51-60 moderate symptoms  Plan:  No evidence of imminent risk to self or others at present.   Supportive therapy provided about ongoing stressors. Discussed crisis plan, support from social network, calling 911, coming to the Emergency Department, and calling Suicide Hotline.  Subjective:   Bryan Myers is a 43 y.o. male patient.  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  Patient states that he is not receiving any outpatient services.  Patient states that he was a patient at [facility] recently for detox.  Patient states that he was started on medication; has not followed up with outpatient referral at this time.   Patient had the following follow ups after discharge form [facility]  Follow up with Follow up with [facility] On 06/16/2013. (Arrive by 8am with temporary Id/letter from [facility], meds, and clothing for screening and possible admission. ) .  Contact information:  [address], [place], [place] [address]  Phone: [phone]  Follow up with Follow up with [facility]. (Walk in between 8am-9am Monday through Friday for hospital followup. ) .  Contact information:  [place], [place]  [phone]   Patient states that he got lost trying to get to [facility].  Patient states that he has only been in [place] for one month.    HPI Elements:   Location:  Alcohol abuse and depression. Quality:  Daily alcohol intake. Severity:  Depression. Timing:  Several weeks.  Review of Systems  Gastrointestinal: Positive for abdominal pain and constipation. Negative for nausea, vomiting and diarrhea.  Musculoskeletal: Negative.   Neurological: Negative for dizziness and tremors.  Psychiatric/Behavioral: Positive for depression and substance abuse. Negative for suicidal ideas, hallucinations and memory loss. The patient is not nervous/anxious and does not have insomnia.     Past Psychiatric History: Past Medical History  Diagnosis Date  . COPD (chronic obstructive pulmonary disease)   . Hernia of abdominal cavity   . Mental disorder   . Depression   . GERD (gastroesophageal reflux disease)   . H/O hiatal hernia   . Headache(784.0)   . Arthritis     reports that he has been smoking Cigarettes.  He has been smoking about 1.00 pack per day. He does not have any smokeless tobacco history on file. He reports that he drinks alcohol. He reports that he does not use illicit drugs. Family History  Problem Relation Age of Onset  . Heart disease Mother   . Heart disease Father   . Heart disease Sister   . Heart disease Brother            Allergies:  No Known Allergies  ACT Assessment Complete:  No:   Past Psychiatric History: Diagnosis:  Alcohol Abuse, Depressive Disorder NOS and Substance Induced Mood Disorder  Hospitalizations:  Yes  Outpatient Care:  Denies  Substance Abuse Care:  Alcohol   Self-Mutilation:  Denies  Suicidal Attempts:   Thoughts  Homicidal Behaviors:  Denies   Violent Behaviors:  Denies   Place of Residence:  [place]; homeless Marital Status:  Single Employed/Unemployed:  Unemployed Education:   Family Supports:  No Objective: Blood pressure 133/84, pulse 65, temperature 98.5 F (36.9 C), temperature source Oral, resp. rate 19, height $RemoveBe'5\' 9"'pudMNOOfV$  (1.753 m), weight 58.968 kg (130 lb), SpO2 98.00%.Body mass index is 19.19 kg/(m^2). Results for orders placed during the hospital encounter of 07/06/13 (from the past 72 hour(s))  CBC WITH DIFFERENTIAL     Status: None   Collection Time    07/06/13 10:00 PM      Result Value Ref Range   WBC 9.1  4.0 - 10.5 K/uL   RBC 4.68  4.22 - 5.81 MIL/uL   Hemoglobin 14.5  13.0 - 17.0 g/dL   HCT 42.9  39.0 - 52.0 %   MCV 91.7  78.0 - 100.0 fL   MCH 31.0  26.0 - 34.0 pg   MCHC 33.8  30.0 - 36.0 g/dL   RDW 14.5  11.5 - 15.5 %   Platelets 310  150 - 400 K/uL   Neutrophils Relative % 55  43 - 77 %   Neutro Abs 4.9  1.7 - 7.7 K/uL   Lymphocytes Relative 34  12 - 46 %   Lymphs Abs 3.0  0.7 - 4.0 K/uL   Monocytes Relative 7  3 - 12 %   Monocytes Absolute 0.7  0.1 - 1.0 K/uL   Eosinophils Relative 4  0 - 5 %   Eosinophils Absolute 0.4  0.0 - 0.7 K/uL   Basophils Relative 0  0 - 1 %   Basophils Absolute 0.0  0.0 - 0.1 K/uL  COMPREHENSIVE METABOLIC PANEL     Status: Abnormal   Collection Time    07/06/13 10:00 PM      Result Value Ref Range   Sodium 143  137 - 147 mEq/L   Potassium 4.0  3.7 - 5.3 mEq/L   Chloride 104  96 - 112 mEq/L   CO2 24  19 - 32 mEq/L   Glucose, Bld 101 (*) 70 - 99 mg/dL   BUN 5 (*) 6 - 23 mg/dL   Creatinine, Ser 0.80  0.50 - 1.35 mg/dL   Calcium 9.1  8.4 - 10.5 mg/dL   Total Protein 7.3  6.0 - 8.3 g/dL   Albumin 4.0  3.5 - 5.2 g/dL   AST 29  0 - 37 U/L   ALT 28  0 - 53 U/L   Alkaline Phosphatase 97  39 - 117 U/L   Total Bilirubin <0.2 (*) 0.3 - 1.2 mg/dL   GFR calc non Af Amer >90  >90 mL/min   GFR calc Af Amer >90  >90 mL/min    Comment: (NOTE)     The eGFR has been calculated using the CKD EPI equation.     This calculation has not been validated in all clinical situations.  eGFR's persistently <90 mL/min signify possible Chronic Kidney     Disease.  ACETAMINOPHEN LEVEL     Status: None   Collection Time    07/06/13 11:10 PM      Result Value Ref Range   Acetaminophen (Tylenol), Serum <15.0  10 - 30 ug/mL   Comment:            THERAPEUTIC CONCENTRATIONS VARY     SIGNIFICANTLY. A RANGE OF 10-30     ug/mL MAY BE AN EFFECTIVE     CONCENTRATION FOR MANY PATIENTS.     HOWEVER, SOME ARE BEST TREATED     AT CONCENTRATIONS OUTSIDE THIS     RANGE.     ACETAMINOPHEN CONCENTRATIONS     >150 ug/mL AT 4 HOURS AFTER     INGESTION AND >50 ug/mL AT 12     HOURS AFTER INGESTION ARE     OFTEN ASSOCIATED WITH TOXIC     REACTIONS.  ETHANOL     Status: Abnormal   Collection Time    07/06/13 11:10 PM      Result Value Ref Range   Alcohol, Ethyl (B) 237 (*) 0 - 11 mg/dL   Comment:            LOWEST DETECTABLE LIMIT FOR     SERUM ALCOHOL IS 11 mg/dL     FOR MEDICAL PURPOSES ONLY  SALICYLATE LEVEL     Status: Abnormal   Collection Time    07/06/13 11:10 PM      Result Value Ref Range   Salicylate Lvl <2.0 (*) 2.8 - 20.0 mg/dL  URINE RAPID DRUG SCREEN (HOSP PERFORMED)     Status: None   Collection Time    07/06/13 11:17 PM      Result Value Ref Range   Opiates NONE DETECTED  NONE DETECTED   Cocaine NONE DETECTED  NONE DETECTED   Benzodiazepines NONE DETECTED  NONE DETECTED   Amphetamines NONE DETECTED  NONE DETECTED   Tetrahydrocannabinol NONE DETECTED  NONE DETECTED   Barbiturates NONE DETECTED  NONE DETECTED   Comment:            DRUG SCREEN FOR MEDICAL PURPOSES     ONLY.  IF CONFIRMATION IS NEEDED     FOR ANY PURPOSE, NOTIFY LAB     WITHIN 5 DAYS.                LOWEST DETECTABLE LIMITS     FOR URINE DRUG SCREEN     Drug Class       Cutoff (ng/mL)     Amphetamine      1000     Barbiturate      200      Benzodiazepine   200     Tricyclics       300     Opiates          300     Cocaine          300     THC              50   Labs are reviewed and are pertinent for ETOH 237.  Medications reviewed.  No changes in medication.    Current Facility-Administered Medications  Medication Dose Route Frequency Provider Last Rate Last Dose  . chlordiazePOXIDE (LIBRIUM) capsule 25 mg  25 mg Oral Q6H PRN Kerry Hough, PA-C      . chlordiazePOXIDE (LIBRIUM) capsule 25 mg  25 mg Oral QID  Laverle Hobby, PA-C   25 mg at 07/07/13 1016   Followed by  . [START ON 07/08/2013] chlordiazePOXIDE (LIBRIUM) capsule 25 mg  25 mg Oral TID Laverle Hobby, PA-C       Followed by  . [START ON 07/09/2013] chlordiazePOXIDE (LIBRIUM) capsule 25 mg  25 mg Oral BH-qamhs Spencer E Simon, PA-C       Followed by  . [START ON 07/10/2013] chlordiazePOXIDE (LIBRIUM) capsule 25 mg  25 mg Oral Daily Laverle Hobby, PA-C      . hydrOXYzine (ATARAX/VISTARIL) tablet 25 mg  25 mg Oral Q6H PRN Laverle Hobby, PA-C      . loperamide (IMODIUM) capsule 2-4 mg  2-4 mg Oral PRN Laverle Hobby, PA-C      . multivitamin with minerals tablet 1 tablet  1 tablet Oral Daily Laverle Hobby, PA-C   1 tablet at 07/07/13 1016  . ondansetron (ZOFRAN-ODT) disintegrating tablet 4 mg  4 mg Oral Q6H PRN Laverle Hobby, PA-C      . pantoprazole (PROTONIX) EC tablet 40 mg  40 mg Oral Daily Garald Balding, NP   40 mg at 07/07/13 1016  . [START ON 07/08/2013] thiamine (VITAMIN B-1) tablet 100 mg  100 mg Oral Daily Laverle Hobby, PA-C      . traZODone (DESYREL) tablet 75 mg  75 mg Oral QHS Garald Balding, NP       Current Outpatient Prescriptions  Medication Sig Dispense Refill  . esomeprazole (NEXIUM) 20 MG capsule Take 20 mg by mouth daily.      . traZODone (DESYREL) 50 MG tablet Take 75 mg by mouth at bedtime.         Psychiatric Specialty Exam:     Blood pressure 133/84, pulse 65, temperature 98.5 F (36.9 C), temperature source Oral, resp.  rate 19, height $RemoveBe'5\' 9"'rIoJkTAeJ$  (1.753 m), weight 58.968 kg (130 lb), SpO2 98.00%.Body mass index is 19.19 kg/(m^2).  General Appearance: Disheveled  Eye Contact::  Good  Speech:  Clear and Coherent and Normal Rate  Volume:  Normal  Mood:  Depressed  Affect:  Congruent  Thought Process:  Circumstantial  Orientation:  Full (Time, Place, and Person)  Thought Content:  "I want rehab"  Suicidal Thoughts:  No  Patient states that he has suicidal thoughts off and on but has not acted.  Patient states that he need rehab for his alcohol.  Denies at this time  Homicidal Thoughts:  No  Memory:  Immediate;   Good Recent;   Good Remote;   Good  Judgement:  Fair  Insight:  Fair  Psychomotor Activity:  Normal  Concentration:  Fair  Recall:  Good  Fund of Knowledge:Good  Language: Good  Akathisia:  No  Handed:  Right  AIMS (if indicated):     Assets:  Communication Skills Desire for Improvement  Sleep:      Musculoskeletal: Strength & Muscle Tone: within normal limits Gait & Station: normal Patient leans: N/A  Treatment Plan Summary: Follow up with outpatient referral  Disposition:  Discharge home.  Patient to follow up with [facility] for outpatient services.  Resource information for [facility] and other rehab services to be given to patient.    Discharge Assessment     Demographic Factors:  Male and Caucasian  Total Time spent with patient: 15 minutes  Psychiatric Specialty Exam: Same as above  Musculoskeletal: Same as above'  Mental Status Per Nursing Assessment::   On Admission:  Current Mental Status by Physician: Patient denies suicidal/homicidal ideation, psychosis, and paranoia  Loss Factors: NA  Historical Factors: NA  Risk Reduction Factors:   NA  Continued Clinical Symptoms:  Alcohol/Substance Abuse/Dependencies  Cognitive Features That Contribute To Risk:  None noted    Suicide Risk:  Minimal: No identifiable suicidal ideation.  Patients presenting with no  risk factors but with morbid ruminations; may be classified as minimal risk based on the severity of the depressive symptoms  Discharge Diagnoses:  Same as above  Plan Of Care/Follow-up recommendations:  Activity:  Resume usual activity Diet:  Resume usual diet  Is patient on multiple antipsychotic therapies at discharge:  No   Has Patient had three or more failed trials of antipsychotic monotherapy by history:  No  Recommended Plan for Multiple Antipsychotic Therapies: NA  Rankin, Shuvon, FNP-BC 07/07/2013 3:31 PM

## 2013-07-07 NOTE — Discharge Instructions (Signed)
Alcohol and Nutrition °Nutrition serves two purposes. It provides energy. It also maintains body structure and function. Food supplies energy. It also provides the building blocks needed to replace worn or damaged cells. Alcoholics often eat poorly. This limits their supply of essential nutrients. This affects energy supply and structure maintenance. Alcohol also affects the body's nutrients in: °· Digestion. °· Storage. °· Using and getting rid of waste products. °IMPAIRMENT OF NUTRIENT DIGESTION AND UTILIZATION  °· Once ingested, food must be broken down into small components (digested). Then it is available for energy. It helps maintain body structure and function. Digestion begins in the mouth. It continues in the stomach and intestines, with help from the pancreas. The nutrients from digested food are absorbed from the intestines into the blood. Then they are carried to the liver. The liver prepares nutrients for: °· Immediate use. °· Storage and future use. °· Alcohol inhibits the breakdown of nutrients into usable molecules. °· It decreases secretion of digestive enzymes from the pancreas. °· Alcohol impairs nutrient absorption by damaging the cells lining the stomach and intestines. °· It also interferes with moving some nutrients into the blood. °· In addition, nutritional deficiencies themselves may lead to further absorption problems. °· For example, folate deficiency changes the cells that line the small intestine. This impairs how water is absorbed. It also affects absorbed nutrients. These include glucose, sodium, and additional folate. °· Even if nutrients are digested and absorbed, alcohol can prevent them from being fully used. It changes their transport, storage, and excretion. Impaired utilization of nutrients by alcoholics is indicated by: °· Decreased liver stores of vitamins, such as vitamin A. °· Increased excretion of nutrients such as fat. °ALCOHOL AND ENERGY SUPPLY  °· Three basic  nutritional components found in food are: °· Carbohydrates. °· Proteins. °· Fats. °· These are used as energy. Some alcoholics take in as much as 50% of their total daily calories from alcohol. They often neglect important foods. °· Even when enough food is eaten, alcohol can impair the ways the body controls blood sugar (glucose) levels. It may either increase or decrease blood sugar. °· In non-diabetic alcoholics, increased blood sugar (hyperglycemia) is caused by poor insulin secretion. It is usually temporary. °· Decreased blood sugar (hypoglycemia) can cause serious injury even if this condition is short-lived. Low blood sugar can happen when a fasting or malnourished person drinks alcohol. When there is no food to supply energy, stored sugar is used up. The products of alcohol inhibit forming glucose from other compounds such as amino acids. As a result, alcohol causes the brain and other body tissue to lack glucose. It is needed for energy and function. °· Alcohol is an energy source. But how the body processes and uses the energy from alcohol is complex. Also, when alcohol is substituted for carbohydrates, subjects tend to lose weight. This indicates that they get less energy from alcohol than from food. °ALCOHOL - MAINTAINING CELL STRUCTURE AND FUNCTION  °Structure °Cells are made mostly of protein. So an adequate protein diet is important for maintaining cell structure. This is especially true if cells are being damaged. Research indicates that alcohol affects protein nutrition by causing impaired: °· Digestion of proteins to amino acids. °· Processing of amino acids by the small intestine and liver. °· Synthesis of proteins from amino acids. °· Protein secretion by the liver. °Function °Nutrients are essential for the body to function well. They provide the tools that the body needs to work well:  °·   Proteins.  Vitamins.  Minerals. Alcohol can disrupt body function. It may cause nutrient  deficiencies. And it may interfere with the way nutrients are processed. Vitamins  Vitamins are essential to maintain growth and normal metabolism. They regulate many of the body`s processes. Chronic heavy drinking causes deficiencies in many vitamins. This is caused by eating less. And, in some cases, vitamins may be poorly absorbed. For example, alcohol inhibits fat absorption. It impairs how the vitamins A, E, and D are normally absorbed along with dietary fats. Not enough vitamin A may cause night blindness. Not enough vitamin D may cause softening of the bones.  Some alcoholics lack vitamins A, C, D, E, K, and the B vitamins. These are all involved in wound healing and cell maintenance. In particular, because vitamin K is necessary for blood clotting, lacking that vitamin can cause delayed clotting. The result is excess bleeding. Lacking other vitamins involved in brain function may cause severe neurological damage. Minerals Deficiencies of minerals such as calcium, magnesium, iron, and zinc are common in alcoholics. The alcohol itself does not seem to affect how these minerals are absorbed. Rather, they seem to occur secondary to other alcohol-related problems, such as:  Less calcium absorbed.  Not enough magnesium.  More urinary excretion.  Vomiting.  Diarrhea.  Not enough iron due to gastrointestinal bleeding.  Not enough zinc or losses related to other nutrient deficiencies.  Mineral deficiencies can cause a variety of medical consequences. These range from calcium-related bone disease to zinc-related night blindness and skin lesions. ALCOHOL, MALNUTRITION, AND MEDICAL COMPLICATIONS  Liver Disease   Alcoholic liver damage is caused primarily by alcohol itself. But poor nutrition may increase the risk of alcohol-related liver damage. For example, nutrients normally found in the liver are known to be affected by drinking alcohol. These include carotenoids, which are the major  sources of vitamin A, and vitamin E compounds. Decreases in such nutrients may play some role in alcohol-related liver damage. Pancreatitis  Research suggests that malnutrition may increase the risk of developing alcoholic pancreatitis. Research suggests that a diet lacking in protein may increase alcohol's damaging effect on the pancreas. Brain  Nutritional deficiencies may have severe effects on brain function. These may be permanent. Specifically, thiamine deficiencies are often seen in alcoholics. They can cause severe neurological problems. These include:  Impaired movement.  Memory loss seen in Wernicke-Korsakoff syndrome. Pregnancy  Alcohol has toxic effects on fetal development. It causes alcohol-related birth defects. They include fetal alcohol syndrome. Alcohol itself is toxic to the fetus. Also, the nutritional deficiency can affect how the fetus develops. That may compound the risk of developmental damage.  Nutritional needs during pregnancy are 10% to 30% greater than normal. Food intake can increase by as much as 140% to cover the needs of both mother and fetus. An alcoholic mother`s nutritional problems may adversely affect the nutrition of the fetus. And alcohol itself can also restrict nutrition flow to the fetus. NUTRITIONAL STATUS OF ALCOHOLICS  Techniques for assessing nutritional status include:  Taking body measurements to estimate fat reserves. They include:  Weight.  Height.  Mass.  Skin fold thickness.  Performing blood analysis to provide measurements of circulating:  Proteins.  Vitamins.  Minerals.  These techniques tend to be imprecise. For many nutrients, there is no clear "cut-off" point that would allow an accurate definition of deficiency. So assessing the nutritional status of alcoholics is limited by these techniques. Dietary status may provide information about the risk of developing nutritional problems.  Dietary status is assessed by:  Taking  patients' dietary histories.  Evaluating the amount and types of food they are eating.  It is difficult to determine what exact amount of alcohol begins to have damaging effects on nutrition. In general, moderate drinkers have 2 drinks or less per day. They seem to be at little risk for nutritional problems. Various medical disorders begin to appear at greater levels.  Research indicates that the majority of even the heaviest drinkers have few obvious nutritional deficiencies. Many alcoholics who are hospitalized for medical complications of their disease do have severe malnutrition. Alcoholics tend to eat poorly. Often they eat less than the amounts of food necessary to provide enough:  Carbohydrates.  Protein.  Fat.  Vitamins A and C.  B vitamins.  Minerals like calcium and iron. Of major concern is alcohol's effect on digesting food and use of nutrients. It may shift a mildly malnourished person toward severe malnutrition. Document Released: 02/06/2005 Document Revised: 07/07/2011 Document Reviewed: 07/23/2005 [facility] Patient Information 2014 [place], [place].  Alcohol Intoxication Alcohol intoxication occurs when you drink enough alcohol that it affects your ability to function. It can be mild or very severe. Drinking a lot of alcohol in a short time is called binge drinking. This can be very harmful. Drinking alcohol can also be more dangerous if you are taking medicines or other drugs. Some of the effects caused by alcohol may include:  Loss of coordination.  Changes in mood and behavior.  Unclear thinking.  Trouble talking (slurred speech).  Throwing up (vomiting).  Confusion.  Slowed breathing.  Twitching and shaking (seizures).  Loss of consciousness. HOME CARE  Do not drive after drinking alcohol.  Drink enough water and fluids to keep your pee (urine) clear or pale yellow. Avoid caffeine.  Only take medicine as told by your doctor. GET HELP IF:  You  throw up (vomit) many times.  You do not feel better after a few days.  You frequently have alcohol intoxication. Your doctor can help decide if you should see a substance use treatment counselor. GET HELP RIGHT AWAY IF:  You become shaky when you stop drinking.  You have twitching and shaking.  You throw up blood. It may look bright red or like coffee grounds.  You notice blood in your poop (bowel movements).  You become lightheaded or pass out (faint). MAKE SURE YOU:   Understand these instructions.  Will watch your condition.  Will get help right away if you are not doing well or get worse. Document Released: 10/01/2007 Document Revised: 12/15/2012 Document Reviewed: 09/17/2012 [facility] Patient Information 2014 [place], [place].  Alcohol Problems Most adults who drink alcohol drink in moderation (not a lot) are at low risk for developing problems related to their drinking. However, all drinkers, including low-risk drinkers, should know about the health risks connected with drinking alcohol. RECOMMENDATIONS FOR LOW-RISK DRINKING  Drink in moderation. Moderate drinking is defined as follows:   Men - no more than 2 drinks per day.  Nonpregnant women - no more than 1 drink per day.  Over age 41 - no more than 1 drink per day. A standard drink is 12 grams of pure alcohol, which is equal to a 12 ounce bottle of beer or wine cooler, a 5 ounce glass of wine, or 1.5 ounces of distilled spirits (such as whiskey, brandy, vodka, or rum).  ABSTAIN FROM (DO NOT DRINK) ALCOHOL:  When pregnant or considering pregnancy.  When taking a medication that interacts with  alcohol.  If you are alcohol dependent.  A medical condition that prohibits drinking alcohol (such as ulcer, liver disease, or heart disease). DISCUSS WITH YOUR CAREGIVER:  If you are at risk for coronary heart disease, discuss the potential benefits and risks of alcohol use: Light to moderate drinking is associated with  lower rates of coronary heart disease in certain populations (for example, men over age 75 and postmenopausal women). Infrequent or nondrinkers are advised not to begin light to moderate drinking to reduce the risk of coronary heart disease so as to avoid creating an alcohol-related problem. Similar protective effects can likely be gained through proper diet and exercise.  Women and the elderly have smaller amounts of body water than men. As a result women and the elderly achieve a higher blood alcohol concentration after drinking the same amount of alcohol.  Exposing a fetus to alcohol can cause a broad range of birth defects referred to as Fetal Alcohol Syndrome (FAS) or Alcohol-Related Birth Defects (ARBD). Although FAS/ARBD is connected with excessive alcohol consumption during pregnancy, studies also have reported neurobehavioral problems in infants born to mothers reporting drinking an average of 1 drink per day during pregnancy.  Heavier drinking (the consumption of more than 4 drinks per occasion by men and more than 3 drinks per occasion by women) impairs learning (cognitive) and psychomotor functions and increases the risk of alcohol-related problems, including accidents and injuries. CAGE QUESTIONS:   Have you ever felt that you should Cut down on your drinking?  Have people Annoyed you by criticizing your drinking?  Have you ever felt bad or Guilty about your drinking?  Have you ever had a drink first thing in the morning to steady your nerves or get rid of a hangover (Eye opener)? If you answered positively to any of these questions: You may be at risk for alcohol-related problems if alcohol consumption is:   Men: Greater than 14 drinks per week or more than 4 drinks per occasion.  Women: Greater than 7 drinks per week or more than 3 drinks per occasion. Do you or your family have a medical history of alcohol-related problems, such as:  Blackouts.  Sexual  dysfunction.  Depression.  Trauma.  Liver dysfunction.  Sleep disorders.  Hypertension.  Chronic abdominal pain.  Has your drinking ever caused you problems, such as problems with your family, problems with your work (or school) performance, or accidents/injuries?  Do you have a compulsion to drink or a preoccupation with drinking?  Do you have poor control or are you unable to stop drinking once you have started?  Do you have to drink to avoid withdrawal symptoms?  Do you have problems with withdrawal such as tremors, nausea, sweats, or mood disturbances?  Does it take more alcohol than in the past to get you high?  Do you feel a strong urge to drink?  Do you change your plans so that you can have a drink?  Do you ever drink in the morning to relieve the shakes or a hangover? If you have answered a number of the previous questions positively, it may be time for you to talk to your caregivers, family, and friends and see if they think you have a problem. Alcoholism is a chemical dependency that keeps getting worse and will eventually destroy your health and relationships. Many alcoholics end up dead, impoverished, or in prison. This is often the end result of all chemical dependency.  Do not be discouraged if you are not ready  to take action immediately.  Decisions to change behavior often involve up and down desires to change and feeling like you cannot decide.  Try to think more seriously about your drinking behavior.  Think of the reasons to quit. WHERE TO GO FOR ADDITIONAL INFORMATION   The National Institute on Alcohol Abuse and Alcoholism (NIAAA) BasicStudents.dk  [employer] on Alcoholism and Drug Dependence (NCADD) www.ncadd.org  American Society of Addiction Medicine (ASAM) RoyalDiary.gl  Document Released: 04/14/2005 Document Revised: 07/07/2011 Document Reviewed: 12/01/2007 [facility] Patient Information 2014 [place], [place].  Alcohol Problems Most  adults who drink alcohol drink in moderation (not a lot) are at low risk for developing problems related to their drinking. However, all drinkers, including low-risk drinkers, should know about the health risks connected with drinking alcohol. RECOMMENDATIONS FOR LOW-RISK DRINKING  Drink in moderation. Moderate drinking is defined as follows:   Men - no more than 2 drinks per day.  Nonpregnant women - no more than 1 drink per day.  Over age 57 - no more than 1 drink per day. A standard drink is 12 grams of pure alcohol, which is equal to a 12 ounce bottle of beer or wine cooler, a 5 ounce glass of wine, or 1.5 ounces of distilled spirits (such as whiskey, brandy, vodka, or rum).  ABSTAIN FROM (DO NOT DRINK) ALCOHOL:  When pregnant or considering pregnancy.  When taking a medication that interacts with alcohol.  If you are alcohol dependent.  A medical condition that prohibits drinking alcohol (such as ulcer, liver disease, or heart disease). DISCUSS WITH YOUR CAREGIVER:  If you are at risk for coronary heart disease, discuss the potential benefits and risks of alcohol use: Light to moderate drinking is associated with lower rates of coronary heart disease in certain populations (for example, men over age 66 and postmenopausal women). Infrequent or nondrinkers are advised not to begin light to moderate drinking to reduce the risk of coronary heart disease so as to avoid creating an alcohol-related problem. Similar protective effects can likely be gained through proper diet and exercise.  Women and the elderly have smaller amounts of body water than men. As a result women and the elderly achieve a higher blood alcohol concentration after drinking the same amount of alcohol.  Exposing a fetus to alcohol can cause a broad range of birth defects referred to as Fetal Alcohol Syndrome (FAS) or Alcohol-Related Birth Defects (ARBD). Although FAS/ARBD is connected with excessive alcohol consumption  during pregnancy, studies also have reported neurobehavioral problems in infants born to mothers reporting drinking an average of 1 drink per day during pregnancy.  Heavier drinking (the consumption of more than 4 drinks per occasion by men and more than 3 drinks per occasion by women) impairs learning (cognitive) and psychomotor functions and increases the risk of alcohol-related problems, including accidents and injuries. CAGE QUESTIONS:   Have you ever felt that you should Cut down on your drinking?  Have people Annoyed you by criticizing your drinking?  Have you ever felt bad or Guilty about your drinking?  Have you ever had a drink first thing in the morning to steady your nerves or get rid of a hangover (Eye opener)? If you answered positively to any of these questions: You may be at risk for alcohol-related problems if alcohol consumption is:   Men: Greater than 14 drinks per week or more than 4 drinks per occasion.  Women: Greater than 7 drinks per week or more than 3 drinks per occasion. Do  you or your family have a medical history of alcohol-related problems, such as:  Blackouts.  Sexual dysfunction.  Depression.  Trauma.  Liver dysfunction.  Sleep disorders.  Hypertension.  Chronic abdominal pain.  Has your drinking ever caused you problems, such as problems with your family, problems with your work (or school) performance, or accidents/injuries?  Do you have a compulsion to drink or a preoccupation with drinking?  Do you have poor control or are you unable to stop drinking once you have started?  Do you have to drink to avoid withdrawal symptoms?  Do you have problems with withdrawal such as tremors, nausea, sweats, or mood disturbances?  Does it take more alcohol than in the past to get you high?  Do you feel a strong urge to drink?  Do you change your plans so that you can have a drink?  Do you ever drink in the morning to relieve the shakes or a  hangover? If you have answered a number of the previous questions positively, it may be time for you to talk to your caregivers, family, and friends and see if they think you have a problem. Alcoholism is a chemical dependency that keeps getting worse and will eventually destroy your health and relationships. Many alcoholics end up dead, impoverished, or in prison. This is often the end result of all chemical dependency.  Do not be discouraged if you are not ready to take action immediately.  Decisions to change behavior often involve up and down desires to change and feeling like you cannot decide.  Try to think more seriously about your drinking behavior.  Think of the reasons to quit. WHERE TO GO FOR ADDITIONAL INFORMATION   The National Institute on Alcohol Abuse and Alcoholism (NIAAA) BasicStudents.dk  [employer] on Alcoholism and Drug Dependence (NCADD) www.ncadd.org  American Society of Addiction Medicine (ASAM) RoyalDiary.gl  Document Released: 04/14/2005 Document Revised: 07/07/2011 Document Reviewed: 12/01/2007 [facility] Patient Information 2014 [place], [place].  Finding Treatment for Alcohol and Drug Addiction It can be hard to find the right place to get professional treatment. Here are some important things to consider:  There are different types of treatment to choose from.  Some programs are live-in (residential) while others are not (outpatient). Sometimes a combination is offered.  No single type of program is right for everyone.  Most treatment programs involve a combination of education, counseling, and a 12-step, spiritually-based approach.  There are non-spiritually based programs (not 12-step).  Some treatment programs are government sponsored. They are geared for patients without private insurance.  Treatment programs can vary in many respects such as:  Cost and types of insurance accepted.  Types of on-site medical services offered.  Length of  stay, setting, and size.  Overall philosophy of treatment. A person may need specialized treatment or have needs not addressed by all programs. For example, adolescents need treatment appropriate for their age. Other people have secondary disorders that must be managed as well. Secondary conditions can include mental illness, such as depression or diabetes. Often, a period of detoxification from alcohol or drugs is needed. This requires medical supervision and not all programs offer this. THINGS TO CONSIDER WHEN SELECTING A TREATMENT PROGRAM   Is the program certified by the appropriate government agency? Even private programs must be certified and employ certified professionals.  Does the program accept your insurance? If not, can a payment plan be set up?  Is the facility clean, organized, and well run? Do they allow you to  speak with graduates who can share their treatment experience with you? Can you tour the facility? Can you meet with staff?  Does the program meet the full range of individual needs?  Does the treatment program address sexual orientation and physical disabilities? Do they provide age, gender, and culturally appropriate treatment services?  Is treatment available in languages other than English?  Is long-term aftercare support or guidance encouraged and provided?  Is assessment of an individual's treatment plan ongoing to ensure it meets changing needs?  Does the program use strategies to encourage reluctant patients to remain in treatment long enough to increase the likelihood of success?  Does the program offer counseling (individual or group) and other behavioral therapies?  Does the program offer medicine as part of the treatment regimen, if needed?  Is there ongoing monitoring of possible relapse? Is there a defined relapse prevention program? Are services or referrals offered to family members to ensure they understand addiction and the recovery process? This  would help them support the recovering individual.  Are 12-step meetings held at the center or is transport available for patients to attend outside meetings? In countries outside of [place], Magazine features editorsee local directories for contact information for services in your area. Document Released: 03/13/2005 Document Revised: 07/07/2011 Document Reviewed: 09/23/2007 [facility] Patient Information 2014 [place].  Depression, Adult Depression is feeling sad, low, down in the dumps, blue, gloomy, or empty. In general, there are two kinds of depression:  Normal sadness or grief. This can happen after something upsetting. It often goes away on its own within 2 weeks. After losing a loved one (bereavement), normal sadness and grief may last longer than two weeks. It usually gets better with time.  Clinical depression. This kind lasts longer than normal sadness or grief. It keeps you from doing the things you normally do in life. It is often hard to function at home, work, or at school. It may affect your relationships with others. Treatment is often needed. GET HELP RIGHT AWAY IF:  You have thoughts about hurting yourself or others.  You lose touch with reality (psychotic symptoms). You may:  See or hear things that are not real.  Have untrue beliefs about your life or people around you.  Your medicine is giving you problems. MAKE SURE YOU:  Understand these instructions.  Will watch your condition.  Will get help right away if you are not doing well or get worse. Document Released: 05/17/2010 Document Revised: 01/07/2012 Document Reviewed: 08/14/2011 [facility] Patient Information 2014 [place], [place].

## 2013-07-07 NOTE — Consult Note (Signed)
Face to face evaluation and I agree with this note 

## 2013-07-08 NOTE — ED Provider Notes (Signed)
Medical screening examination/treatment/procedure(s) were performed by non-physician practitioner and as supervising physician I was immediately available for consultation/collaboration.   EKG Interpretation None       Vivica Dobosz, MD 07/08/13 1906 

## 2013-07-15 ENCOUNTER — Emergency Department (HOSPITAL_COMMUNITY)
Admission: EM | Admit: 2013-07-15 | Discharge: 2013-07-15 | Disposition: A | Discharge status: Home / Self Care | Payer: Self-pay | Attending: Emergency Medicine | Admitting: Emergency Medicine

## 2013-07-15 ENCOUNTER — Encounter (HOSPITAL_COMMUNITY): Payer: Self-pay | Admitting: Emergency Medicine

## 2013-07-15 ENCOUNTER — Emergency Department (HOSPITAL_COMMUNITY): Payer: Self-pay

## 2013-07-15 DIAGNOSIS — Z79899 Other long term (current) drug therapy: Secondary | ICD-10-CM | POA: Insufficient documentation

## 2013-07-15 DIAGNOSIS — F329 Major depressive disorder, single episode, unspecified: Secondary | ICD-10-CM | POA: Insufficient documentation

## 2013-07-15 DIAGNOSIS — J449 Chronic obstructive pulmonary disease, unspecified: Secondary | ICD-10-CM | POA: Insufficient documentation

## 2013-07-15 DIAGNOSIS — M79672 Pain in left foot: Secondary | ICD-10-CM

## 2013-07-15 DIAGNOSIS — J4489 Other specified chronic obstructive pulmonary disease: Secondary | ICD-10-CM | POA: Insufficient documentation

## 2013-07-15 DIAGNOSIS — Z8739 Personal history of other diseases of the musculoskeletal system and connective tissue: Secondary | ICD-10-CM | POA: Insufficient documentation

## 2013-07-15 DIAGNOSIS — K219 Gastro-esophageal reflux disease without esophagitis: Secondary | ICD-10-CM | POA: Insufficient documentation

## 2013-07-15 DIAGNOSIS — R4789 Other speech disturbances: Secondary | ICD-10-CM | POA: Insufficient documentation

## 2013-07-15 DIAGNOSIS — S8990XA Unspecified injury of unspecified lower leg, initial encounter: Secondary | ICD-10-CM | POA: Insufficient documentation

## 2013-07-15 DIAGNOSIS — F3289 Other specified depressive episodes: Secondary | ICD-10-CM | POA: Insufficient documentation

## 2013-07-15 DIAGNOSIS — W19XXXA Unspecified fall, initial encounter: Secondary | ICD-10-CM

## 2013-07-15 DIAGNOSIS — M79671 Pain in right foot: Secondary | ICD-10-CM

## 2013-07-15 DIAGNOSIS — S99919A Unspecified injury of unspecified ankle, initial encounter: Principal | ICD-10-CM

## 2013-07-15 DIAGNOSIS — F17200 Nicotine dependence, unspecified, uncomplicated: Secondary | ICD-10-CM | POA: Insufficient documentation

## 2013-07-15 DIAGNOSIS — R296 Repeated falls: Secondary | ICD-10-CM | POA: Insufficient documentation

## 2013-07-15 DIAGNOSIS — Y9289 Other specified places as the place of occurrence of the external cause: Secondary | ICD-10-CM | POA: Insufficient documentation

## 2013-07-15 DIAGNOSIS — Y939 Activity, unspecified: Secondary | ICD-10-CM | POA: Insufficient documentation

## 2013-07-15 DIAGNOSIS — S99929A Unspecified injury of unspecified foot, initial encounter: Principal | ICD-10-CM

## 2013-07-15 NOTE — ED Notes (Signed)
Sanders, PA at bedside.  

## 2013-07-15 NOTE — Discharge Instructions (Signed)
Follow-up with your primary care physician. °Return to the ED for new concerns. °

## 2013-07-15 NOTE — ED Notes (Signed)
Patient refused to be placed on the monitor and also refused a gown.

## 2013-07-15 NOTE — ED Provider Notes (Signed)
CSN: [id number]     Arrival date & time 07/15/13  0200 History   First MD Initiated Contact with Patient 07/15/13 [id number]     Chief Complaint  Patient presents with  . Fall     (Consider location/radiation/quality/duration/timing/severity/associated sxs/prior Treatment) The history is provided by the patient and medical records.   This is a 43 y.o. M with PMH significant for COPD, depression, GERD, hiatal hernia, mental disorder presenting to the ED for bilateral shin and foot pain.  Pt states he was drinking "a few beers" and had a slight fall over a railing.  He denies head trauma or LOC.  States he was having pain with walking but this has since resolved.  States now he just feels "hungover" and tired.  Pt does have old abrasions and bruising to bilateral shins but states these are old from work.  Pt works in [employer] and is on his hands and knees a lot.  Denies headache, dizziness, tinnitus, or confusion.  VS stable on arrival.  Past Medical History  Diagnosis Date  . COPD (chronic obstructive pulmonary disease)   . Hernia of abdominal cavity   . Mental disorder   . Depression   . GERD (gastroesophageal reflux disease)   . H/O hiatal hernia   . Headache(784.0)   . Arthritis    Past Surgical History  Procedure Laterality Date  . 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    . Inguinal hernia repair Left 06/14/2013    Procedure: HERNIA REPAIR INGUINAL INCARCERATED;  Surgeon: Shelly Rubenstein, MD;  Location: [facility];  Service: General;  Laterality: Left;  . Insertion of mesh Left 06/14/2013    Procedure: INSERTION OF MESH;  Surgeon: Shelly Rubenstein, MD;  Location: [facility];  Service: General;  Laterality: Left;   Family History  Problem Relation Age of Onset  . Heart disease Mother   . Heart disease Father   . Heart disease Sister   . Heart disease Brother    History  Substance Use Topics  . Smoking status: Current Every Day Smoker --  1.00 packs/day    Types: Cigarettes  . Smokeless tobacco: Not on file  . Alcohol Use: Yes     Comment: 2-12, 12oz beers, daily     Review of Systems  Musculoskeletal: Positive for arthralgias.  All other systems reviewed and are negative.    Allergies  Review of patient's allergies indicates no known allergies.  Home Medications   Current Outpatient Rx  Name  Route  Sig  Dispense  Refill  . esomeprazole (NEXIUM) 20 MG capsule   Oral   Take 20 mg by mouth daily.         . traZODone (DESYREL) 50 MG tablet   Oral   Take 75 mg by mouth at bedtime.           BP 109/75  Pulse 89  Temp(Src) 97.8 F (36.6 C) (Oral)  Resp 17  SpO2 96%  Physical Exam  Nursing note and vitals reviewed. Constitutional: He is oriented to person, place, and time. He appears well-developed and well-nourished.  Sleeping in bed, drowsy  HENT:  Head: Normocephalic and atraumatic.  Mouth/Throat: Oropharynx is clear and moist.  No visible signs of head trauma  Eyes: Conjunctivae and EOM are normal. Pupils are equal, round, and reactive to light.  Neck: Normal range of motion.  Cardiovascular: Normal rate, regular rhythm and normal heart sounds.   Pulmonary/Chest: Effort normal  and breath sounds normal.  Abdominal: Soft. Bowel sounds are normal.  Musculoskeletal: Normal range of motion.  Old abrasions and bruising to bilateral shins with overlying scabs, no active bleeding or signs of infection Both feet normal in appearance without signs of trauma or deformity; distal pulses and sensation intact  Neurological: He is alert and oriented to person, place, and time.  Pt appears drowsy but is AAOx3, will follow commands, moving extremities without ataxia  Skin: Skin is warm and dry.  Psychiatric: His speech is slurred.  Slurred speech    ED Course  Procedures (including critical care time) Labs Review Labs Reviewed - No data to display Imaging Review Dg Foot Complete Left  07/15/2013    CLINICAL DATA:  Fall.  Left foot pain.  EXAM: LEFT FOOT - COMPLETE 3+ VIEW  COMPARISON:  None.  FINDINGS: Imaged bones, joints and soft tissues appear normal.  IMPRESSION: Negative exam.   Electronically Signed   By: Drusilla Kannerhomas  Dalessio M.D.   On: 07/15/2013 02:51   Dg Foot Complete Right  07/15/2013   CLINICAL DATA:  Fall.  Right foot pain.  EXAM: RIGHT FOOT COMPLETE - 3+ VIEW  COMPARISON:  None.  FINDINGS: Imaged bones, joints and soft tissues appear normal.  IMPRESSION: Negative exam.   Electronically Signed   By: Drusilla Kannerhomas  Dalessio M.D.   On: 07/15/2013 02:51     EKG Interpretation None      MDM   Final diagnoses:  Fall  Bilateral foot pain   Upon initial evaluation, pt is sleeping in bed, NAD.  He no longer complains of foot or shin pain, states he feels hungover and just wants to go home and sleep but.  He still appears somewhat drowsy with slurred speech.  Will allow to sober and reassess.  Pt has ambulated to bathroom with non-ataxic gait and eaten meal in the ED.  He is alert, oriented, and clinically sober. Pt will be discharged home.  He will FU with his PCP if problems occur.  Return precautions advised.  Garlon HatchetLisa M Veronnica Hennings, PA-C 07/15/13 1106  Garlon HatchetLisa M Bond Grieshop, PA-C 07/15/13 [phone]

## 2013-07-15 NOTE — ED Notes (Addendum)
Pt states that he fell over a railing and is now complaining of pain in his shins and feet.  Pt denies LOC or dizziness.  Pt states his feet hurt to walk at current.  Pt states he has been drinking tonight, but that he was "helped" over the railing.

## 2013-07-15 NOTE — ED Provider Notes (Signed)
Medical screening examination/treatment/procedure(s) were performed by non-physician practitioner and as supervising physician I was immediately available for consultation/collaboration.   EKG Interpretation None       Bryan Mackielga M Salley Boxley, MD 07/15/13 1743

## 2015-04-27 IMAGING — US US SCROTUM
1 series · 14 of 25 positions shown · non-contrast
Comparison: None.

CLINICAL DATA: Swelling and pain.  No left known inguinal hernia

EXAM:
ULTRASOUND OF SCROTUM
TECHNIQUE: Complete ultrasound examination of the testicles, epididymis, and
other scrotal structures was performed.

[Series 1: us scrotum · 0.08mm/px · 14 of 63 slices shown]
[im 1/63]
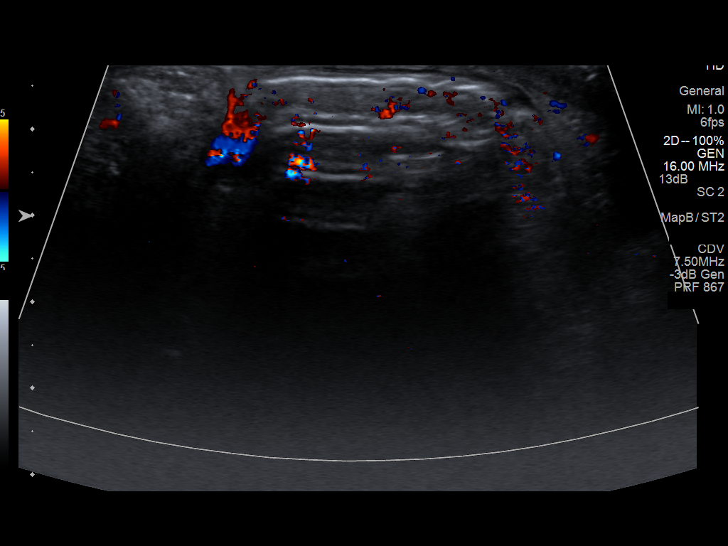
[im 6/63]
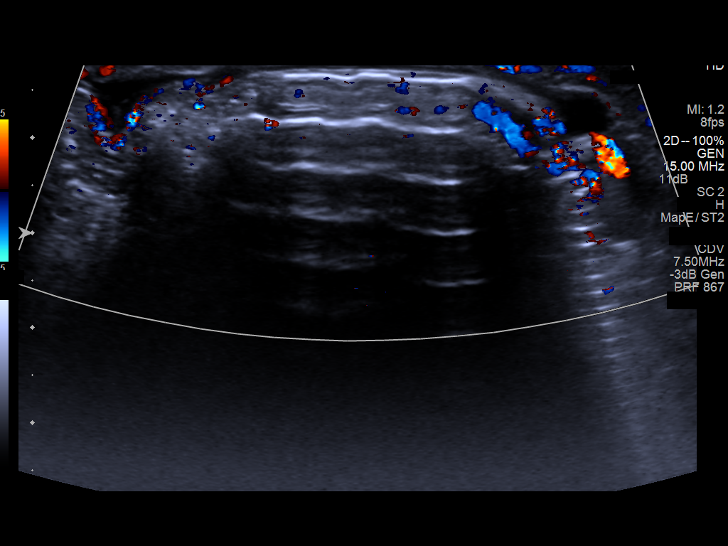
[im 11/63]
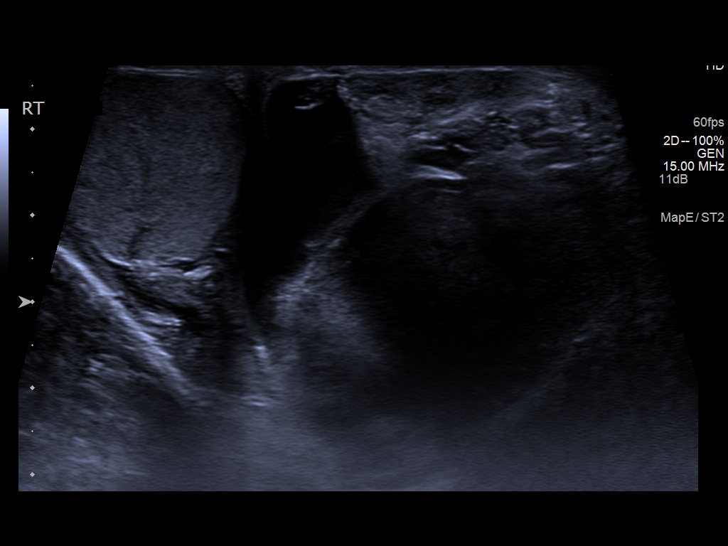
[im 16/63]
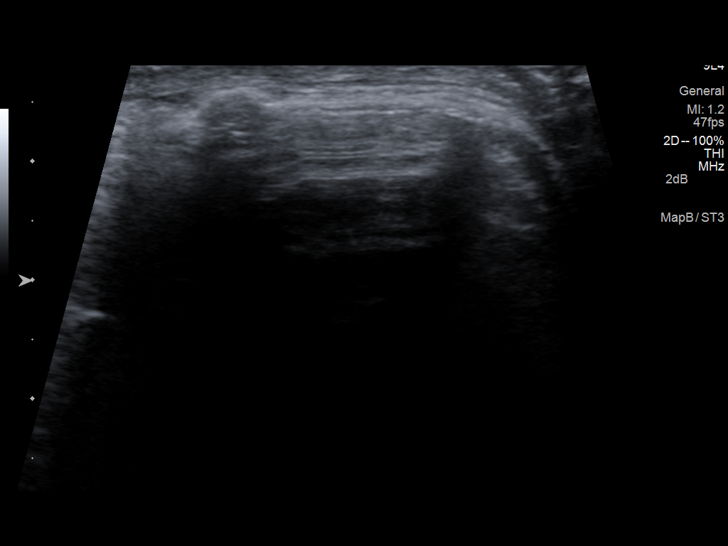
[im 21/63]
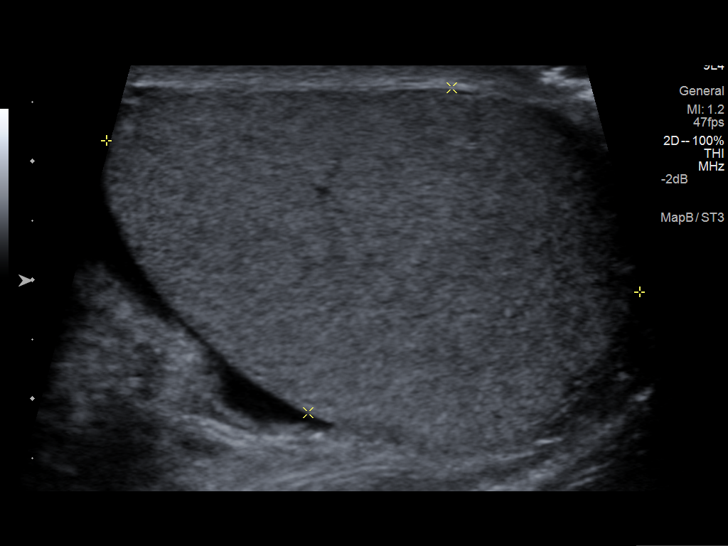
[im 24/63]
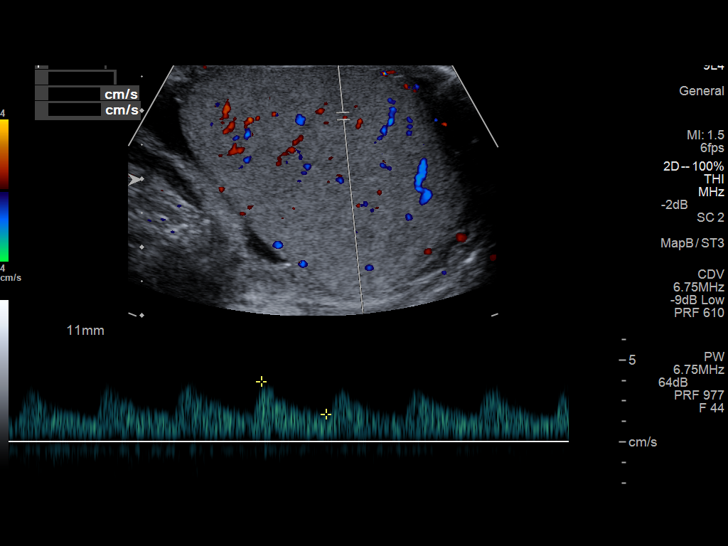
[im 29/63]
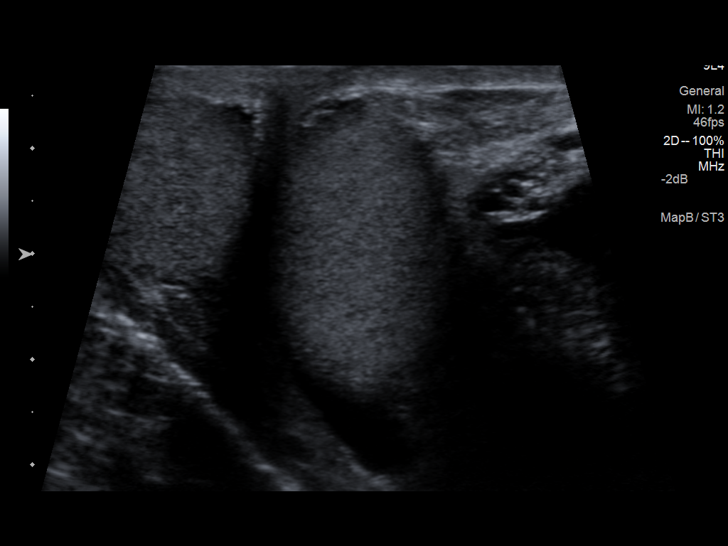
[im 34/63]
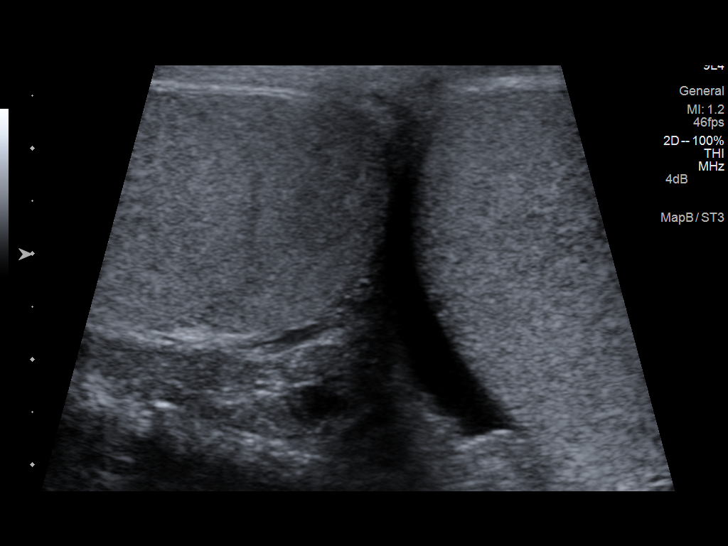
[im 39/63]
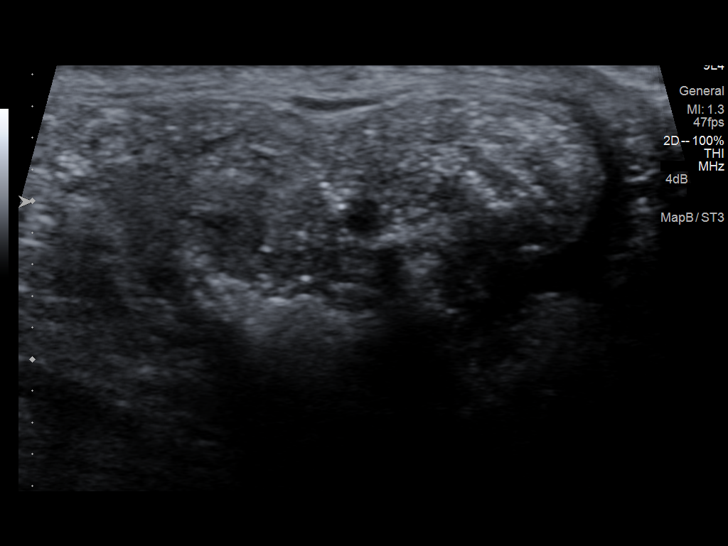
[im 42/63]
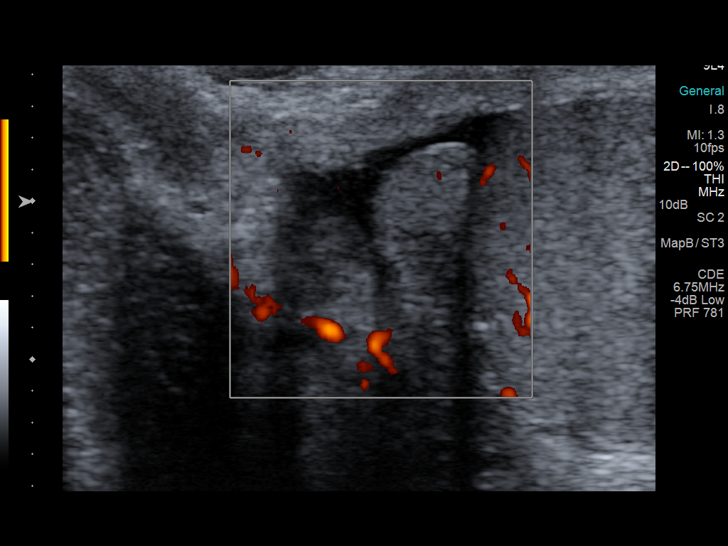
[im 47/63]
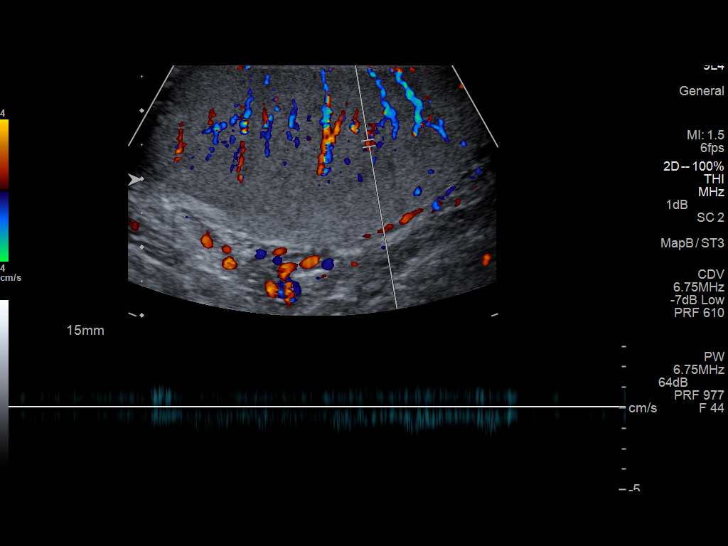
[im 52/63]
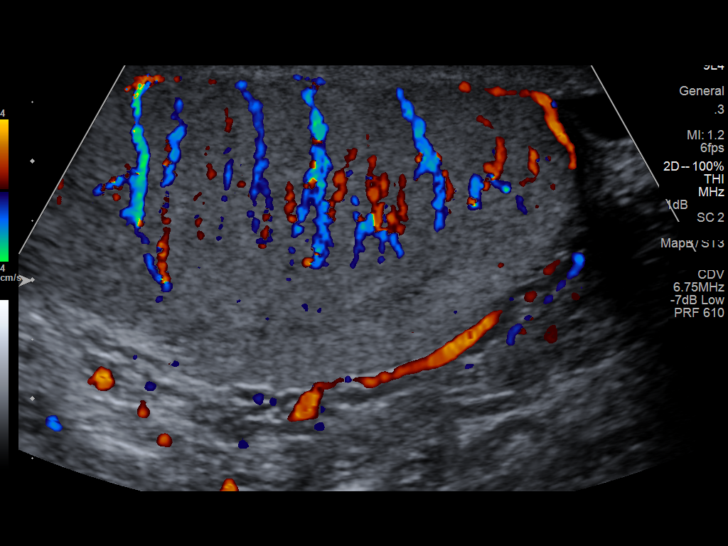
[im 57/63]
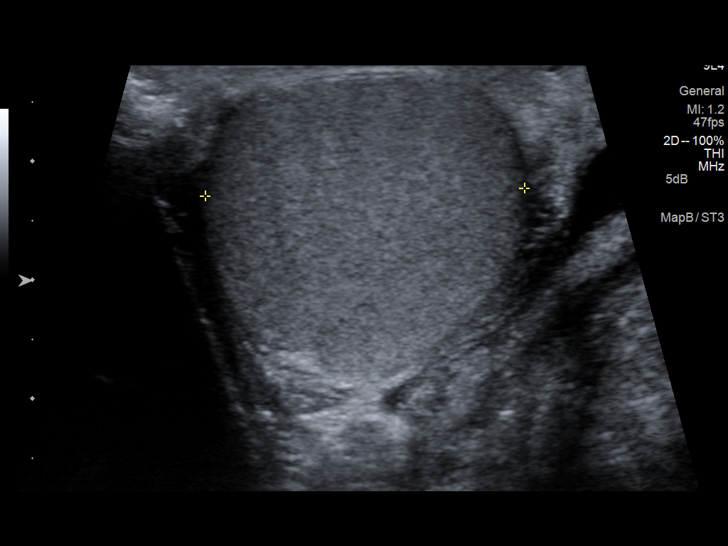
[im 63/63]
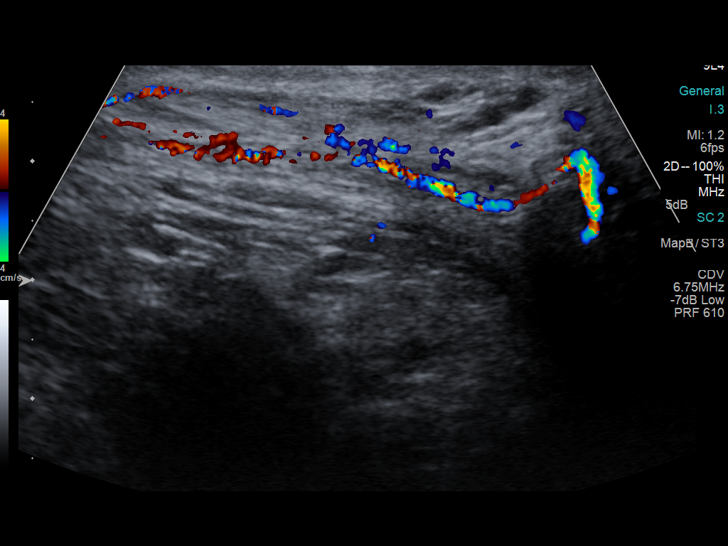

[14 of 25 positions shown; findings below may reference images not displayed]

FINDINGS: Right testicle

Measurements: 5.5 x 2.6 x 2.7 cm. No mass or microlithiasis
visualized.

Left testicle

Measurements: 4.7 x 3.0 x 3.6 cm. The left testicle is displaced
inferiorly a left inguinal hernia. Short loop of bowel is noted to
extend into the left hemiscrotum .

Right epididymis:  Normal in size and appearance.

Left epididymis:  Normal in size and appearance.

Hydrocele:  Small bilateral hydroceles trauma (right.

Varicocele:  None visualized.
IMPRESSION: 1. Left inguinal hernia with loop of bowel mildly extending into the
left hemiscrotum.
2. Normal testicles with normal color Doppler flow and spectral
waveforms.
